# Patient Record
Sex: Female | Born: 1975 | Race: Black or African American | Hispanic: No | Marital: Single | Smoking: Former smoker
Health system: Southern US, Community
[De-identification: ages and names within clinical notes are randomized; demographics above are authoritative.]

## PROBLEM LIST (undated history)

## (undated) DIAGNOSIS — E0590 Thyrotoxicosis, unspecified without thyrotoxic crisis or storm: Secondary | ICD-10-CM

## (undated) DIAGNOSIS — E559 Vitamin D deficiency, unspecified: Secondary | ICD-10-CM

## (undated) DIAGNOSIS — F329 Major depressive disorder, single episode, unspecified: Secondary | ICD-10-CM

## (undated) DIAGNOSIS — F4310 Post-traumatic stress disorder, unspecified: Secondary | ICD-10-CM

## (undated) DIAGNOSIS — F419 Anxiety disorder, unspecified: Secondary | ICD-10-CM

## (undated) DIAGNOSIS — R002 Palpitations: Secondary | ICD-10-CM

## (undated) DIAGNOSIS — F32A Depression, unspecified: Secondary | ICD-10-CM

## (undated) DIAGNOSIS — J45909 Unspecified asthma, uncomplicated: Secondary | ICD-10-CM

## (undated) HISTORY — PX: ECTOPIC PREGNANCY SURGERY: SHX613

## (undated) HISTORY — DX: Vitamin D deficiency, unspecified: E55.9

## (undated) HISTORY — DX: Palpitations: R00.2

## (undated) HISTORY — DX: Depression, unspecified: F32.A

## (undated) HISTORY — DX: Thyrotoxicosis, unspecified without thyrotoxic crisis or storm: E05.90

## (undated) HISTORY — DX: Major depressive disorder, single episode, unspecified: F32.9

## (undated) HISTORY — DX: Anxiety disorder, unspecified: F41.9

---

## 2005-08-18 ENCOUNTER — Emergency Department: Payer: Self-pay | Admitting: Emergency Medicine

## 2005-08-20 ENCOUNTER — Emergency Department: Payer: Self-pay | Admitting: Emergency Medicine

## 2005-08-21 ENCOUNTER — Ambulatory Visit: Payer: Self-pay

## 2005-09-17 ENCOUNTER — Other Ambulatory Visit: Payer: Self-pay

## 2005-09-17 ENCOUNTER — Inpatient Hospital Stay: Payer: Self-pay | Admitting: Obstetrics & Gynecology

## 2006-01-07 ENCOUNTER — Observation Stay: Payer: Self-pay | Admitting: Obstetrics & Gynecology

## 2007-10-30 ENCOUNTER — Emergency Department: Payer: Self-pay | Admitting: Emergency Medicine

## 2008-01-29 ENCOUNTER — Emergency Department: Payer: Self-pay | Admitting: Emergency Medicine

## 2009-06-27 ENCOUNTER — Emergency Department: Payer: Self-pay | Admitting: Internal Medicine

## 2009-07-16 ENCOUNTER — Emergency Department: Payer: Self-pay | Admitting: Emergency Medicine

## 2011-07-23 ENCOUNTER — Ambulatory Visit: Payer: Self-pay | Admitting: Family Medicine

## 2011-08-22 ENCOUNTER — Ambulatory Visit: Payer: Self-pay | Admitting: Family Medicine

## 2011-12-04 ENCOUNTER — Ambulatory Visit: Payer: Self-pay

## 2011-12-12 ENCOUNTER — Ambulatory Visit: Payer: Self-pay

## 2012-10-10 IMAGING — US US THYROID
1 series · 17 of 25 positions shown · non-contrast
Comparison: none

REASON FOR EXAM: goiter
COMMENTS:

[Series 1: us thyroid · 17 of 45 slices shown]
[im 1/45]
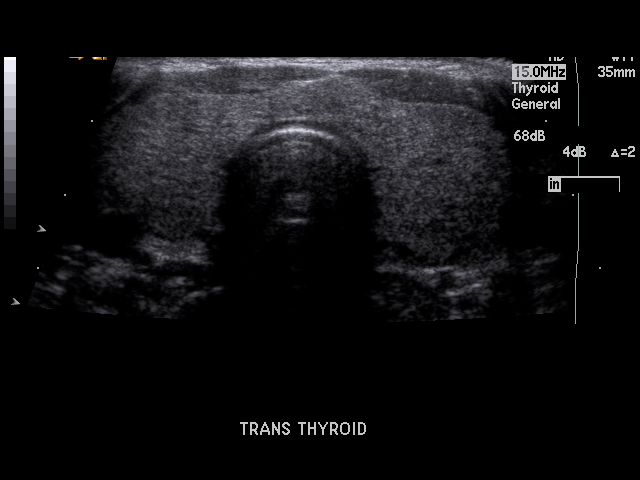
[im 4/45]
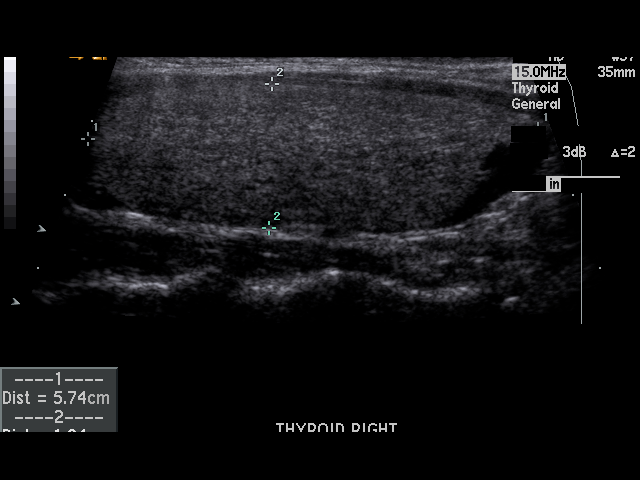
[im 6/45]
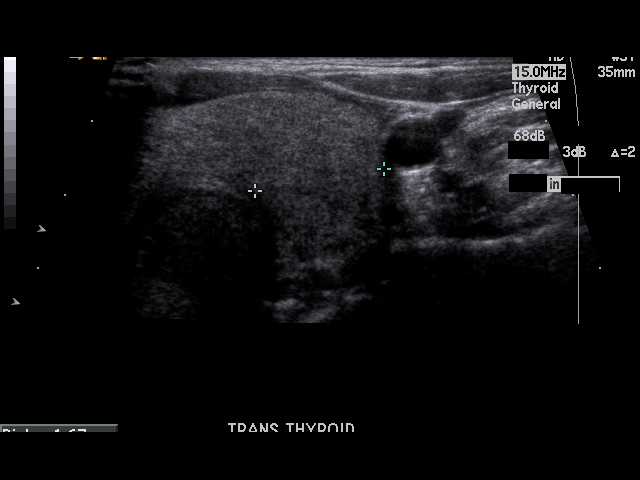
[im 10/45]
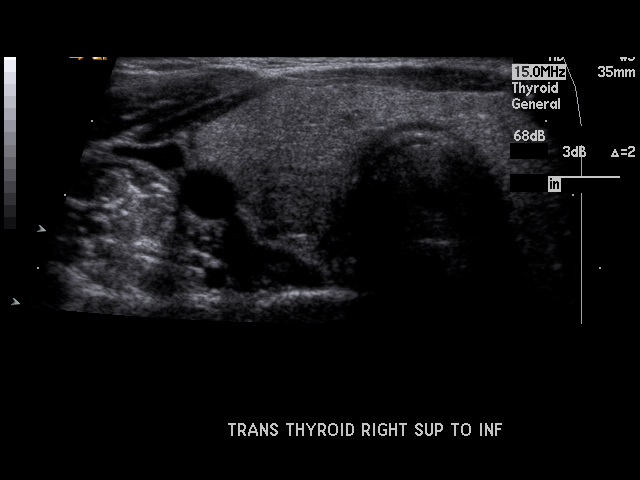
[im 12/45]
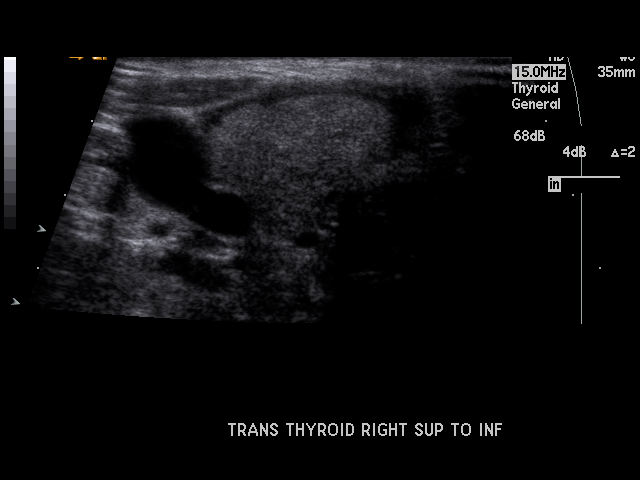
[im 15/45]
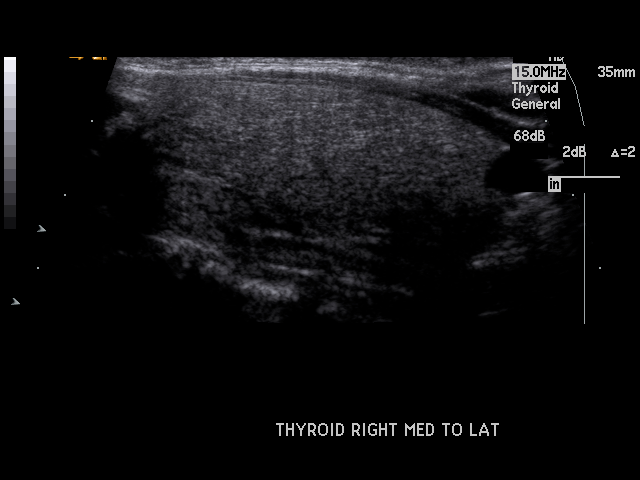
[im 17/45]
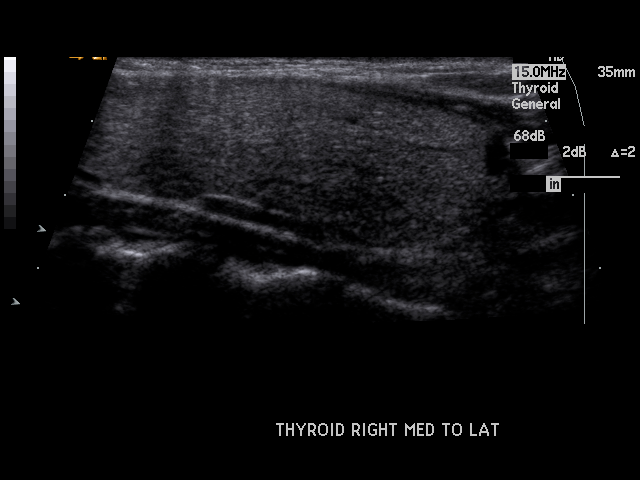
[im 21/45]
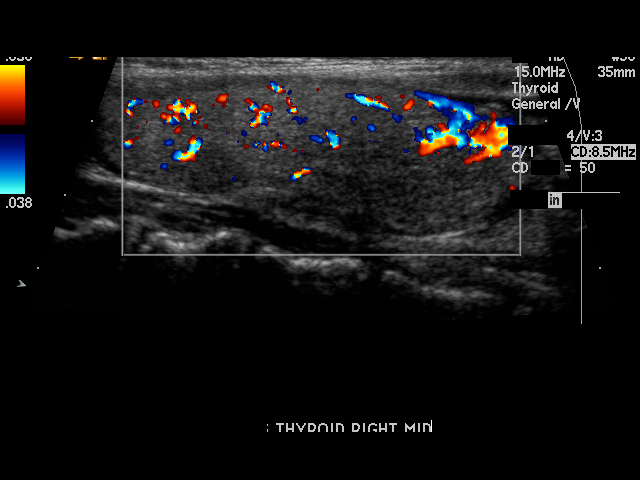
[im 23/45]
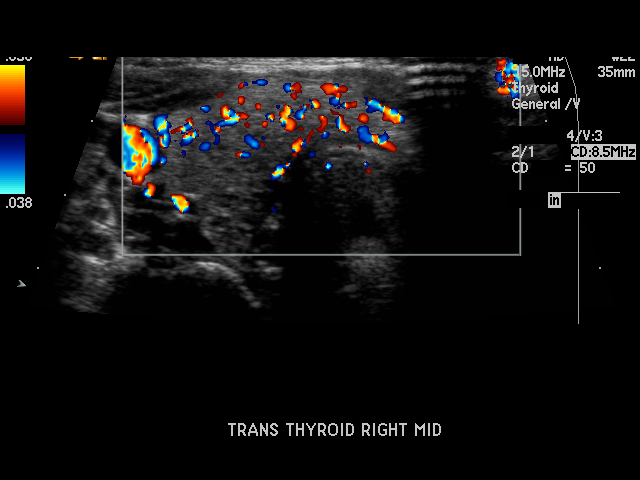
[im 24/45]
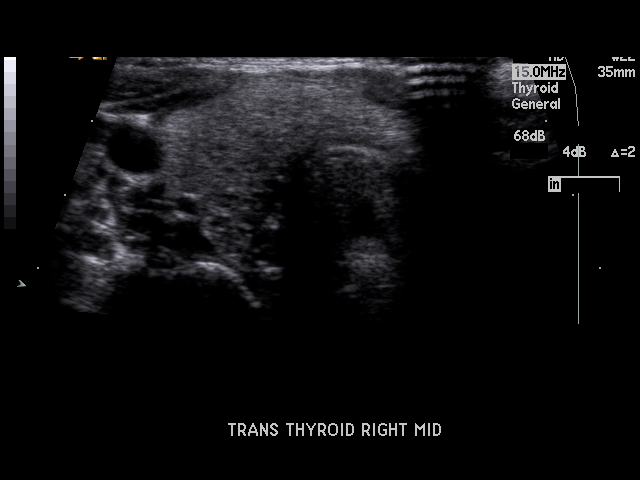
[im 28/45]
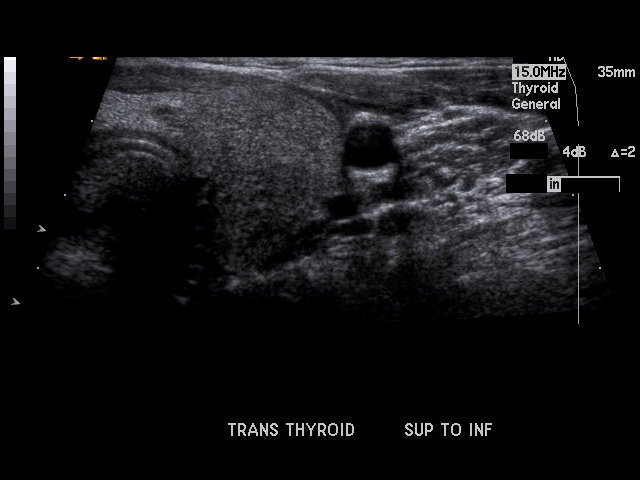
[im 30/45]
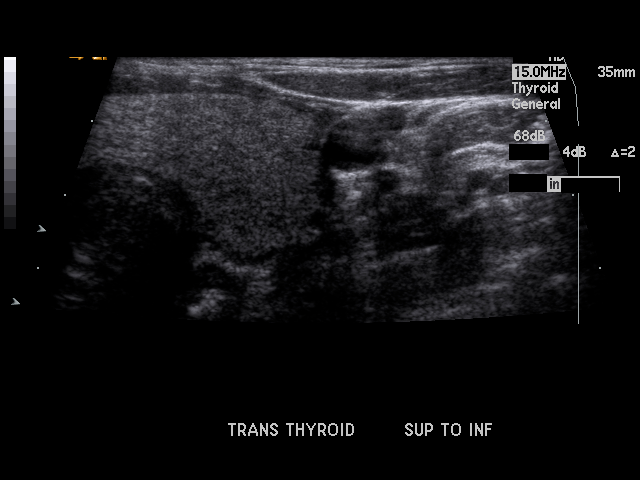
[im 34/45]
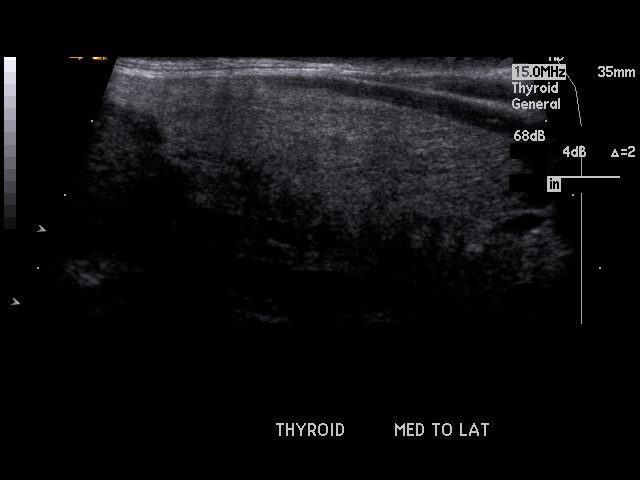
[im 35/45]
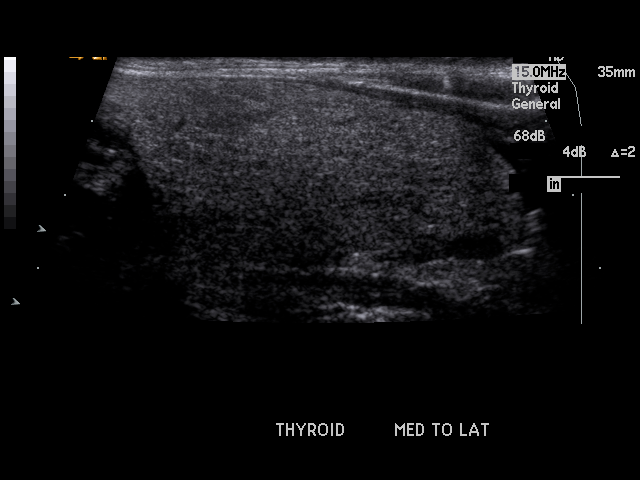
[im 39/45]
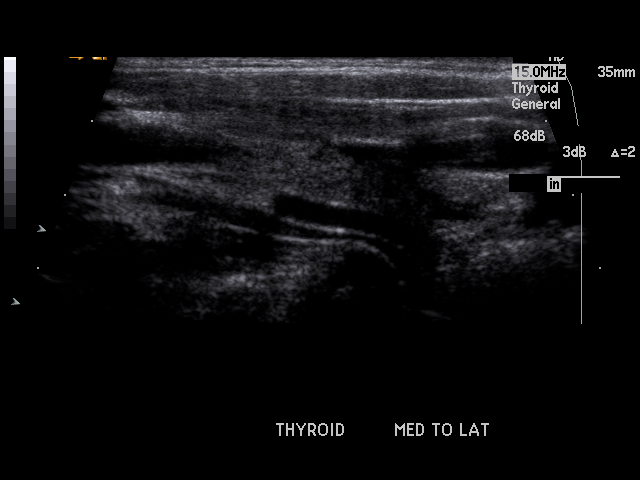
[im 41/45]
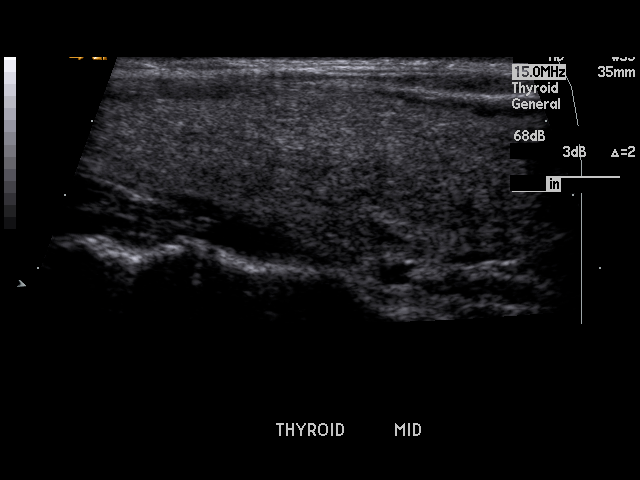
[im 45/45]
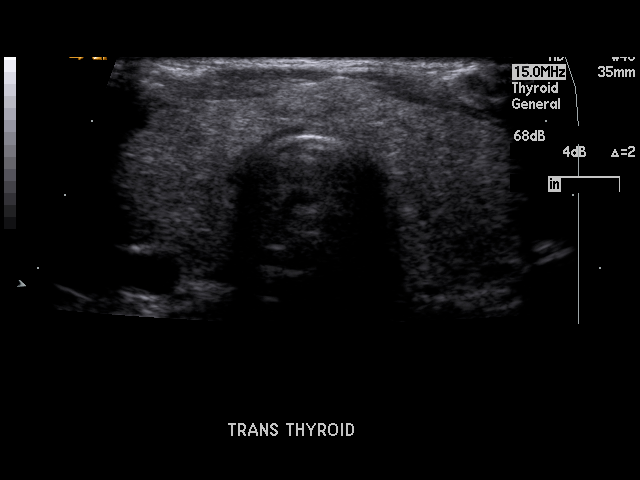

[17 of 25 positions shown; findings below may reference images not displayed]

PROCEDURE:     AUJLA - AUJLA THYROID  - July 23, 2011  [DATE]

RESULT:     The right lobe of the thyroid measures 5.74 cm x 1.84 cm x
cm and the left lobe measures 6.18 cm x 1.89 cm x 1.67 cm. Thyroid
echotexture bilaterally is homogeneous. No thyroid masses or nodules are
seen. No thyroid calcifications are identified.
IMPRESSION: 1. The thyroid appears large but no definite thyroid masses or nodules are
seen.
2. No thyroid calcifications are identified.

## 2013-02-21 IMAGING — NM NM THYROID IMAGING W/ UPTAKE SINGLE (24 HR)
1 series · 3 of 3 positions shown · non-contrast
Comparison: none

REASON FOR EXAM: hyperthyroidism
COMMENTS:

[Series 1000: (id) thyroid scan · 2.40mm/px · 3 of 3 slices shown]
[im 1/3  full-range]
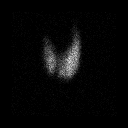
[im 2/3]
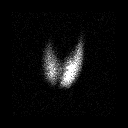
[im 3/3  full-range]
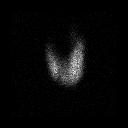

[3 of 3 positions shown; findings below may reference images not displayed]

PROCEDURE:     KNM - KNM THYROID 7-PQN 24HR [DATE]  [DATE]

RESULT:     The patient received a dose of 146.9 uCi of 5odine-FS9. Uptake
is calculated at 6 and 24 hours and measured at 40.1% and 57.4% respectively
which is elevated above the range of normal area images show relatively
homogeneous uptake throughout both glands. The left gland is larger than the
right. There is overall slightly decreased uptake on the right compared to
the left some of which could be secondary to differences and geometry or
thickness. No photopenic or dominant mass is evident.
IMPRESSION: 1. Findings consistent with hyperthyroidism. No focal photopenic or dominant
area of uptake. No suppression demonstrated.

## 2013-06-09 ENCOUNTER — Ambulatory Visit: Payer: Self-pay | Admitting: Family Medicine

## 2014-11-22 ENCOUNTER — Ambulatory Visit: Payer: Self-pay | Admitting: Obstetrics and Gynecology

## 2014-11-22 LAB — PREGNANCY, URINE: Pregnancy Test, Urine: NEGATIVE m[IU]/mL

## 2015-02-21 ENCOUNTER — Encounter: Payer: Self-pay | Admitting: Emergency Medicine

## 2015-02-21 ENCOUNTER — Emergency Department
Admission: EM | Admit: 2015-02-21 | Discharge: 2015-02-21 | Disposition: A | Discharge status: Home / Self Care | Payer: Medicaid Other | Attending: Emergency Medicine | Admitting: Emergency Medicine

## 2015-02-21 DIAGNOSIS — R1084 Generalized abdominal pain: Secondary | ICD-10-CM | POA: Diagnosis present

## 2015-02-21 DIAGNOSIS — N938 Other specified abnormal uterine and vaginal bleeding: Secondary | ICD-10-CM | POA: Diagnosis not present

## 2015-02-21 DIAGNOSIS — Z3202 Encounter for pregnancy test, result negative: Secondary | ICD-10-CM | POA: Insufficient documentation

## 2015-02-21 DIAGNOSIS — Z720 Tobacco use: Secondary | ICD-10-CM | POA: Diagnosis not present

## 2015-02-21 LAB — BASIC METABOLIC PANEL
Anion gap: 6 (ref 5–15)
BUN: 10 mg/dL (ref 6–20)
CO2: 28 mmol/L (ref 22–32)
Calcium: 9.1 mg/dL (ref 8.9–10.3)
Chloride: 107 mmol/L (ref 101–111)
Creatinine, Ser: 0.66 mg/dL (ref 0.44–1.00)
Glucose, Bld: 96 mg/dL (ref 65–99)
Potassium: 3.6 mmol/L (ref 3.5–5.1)
Sodium: 141 mmol/L (ref 135–145)

## 2015-02-21 LAB — CBC WITH DIFFERENTIAL/PLATELET
BASOS ABS: 0 10*3/uL (ref 0–0.1)
Basophils Relative: 0 %
EOS ABS: 0 10*3/uL (ref 0–0.7)
Eosinophils Relative: 1 %
HCT: 40.2 % (ref 35.0–47.0)
Hemoglobin: 13.3 g/dL (ref 12.0–16.0)
LYMPHS ABS: 1.8 10*3/uL (ref 1.0–3.6)
Lymphocytes Relative: 28 %
MCH: 35.3 pg — ABNORMAL HIGH (ref 26.0–34.0)
MCHC: 33.1 g/dL (ref 32.0–36.0)
MCV: 106.4 fL — AB (ref 80.0–100.0)
MONO ABS: 0.5 10*3/uL (ref 0.2–0.9)
MONOS PCT: 8 %
NEUTROS ABS: 4 10*3/uL (ref 1.4–6.5)
Neutrophils Relative %: 63 %
Platelets: 202 10*3/uL (ref 150–440)
RBC: 3.78 MIL/uL — AB (ref 3.80–5.20)
RDW: 14.7 % — AB (ref 11.5–14.5)
WBC: 6.4 10*3/uL (ref 3.6–11.0)

## 2015-02-21 LAB — URINALYSIS COMPLETE WITH MICROSCOPIC (ARMC ONLY)
Bilirubin Urine: NEGATIVE
GLUCOSE, UA: NEGATIVE mg/dL
HGB URINE DIPSTICK: NEGATIVE
Leukocytes, UA: NEGATIVE
NITRITE: NEGATIVE
PH: 6 (ref 5.0–8.0)
Protein, ur: NEGATIVE mg/dL
Specific Gravity, Urine: 1.018 (ref 1.005–1.030)

## 2015-02-21 MED ORDER — ACETAMINOPHEN 325 MG PO TABS
ORAL_TABLET | ORAL | Status: AC
  Filled 2015-02-21: qty 2

## 2015-02-21 MED ORDER — ACETAMINOPHEN 325 MG PO TABS
650.0000 mg | ORAL_TABLET | Freq: Once | ORAL | Status: AC
  Administered 2015-02-21: 650 mg via ORAL

## 2015-02-21 NOTE — ED Notes (Signed)
Pt c/o lower abd pain for the past 3-4 days, states she just stopped her normal period 6 days ago and has had spotting for the past couple of days.

## 2015-02-21 NOTE — ED Notes (Signed)
Pt c/o lower abd cramping and intermittent scant vaginal bleeding x1 week, pt also noted tan discharge. Denies urinary symptoms or fever - also c/o HA as well which she attributes to ED wait times. Pt on cell phone during assessment. Pt's daughter provided w/ snacks and juice. Protocol orders initiated for APAP for pt's HA. Plan of care discussed and explained ER flow w/ pt - pt verbalized understanding however continues to be agitated at wait time.

## 2015-02-21 NOTE — ED Provider Notes (Signed)
[facility] Emergency Department Provider Note    ____________________________________________  Time seen: 21:40  I have reviewed the triage vital signs and the nursing notes.   HISTORY  Chief Complaint Abdominal Pain and Vaginal Bleeding       HPI Cheyenne Callahan is a 39 y.o. female who complains of several days of persistent vaginal bleeding after having her normal period about a week ago as well as some lower abdominal cramping. She states that this cramping feel similar to her usual menstrual pain. She states that it is moderate at its most severe and is currently absent. She states that she is typically very regular and does not understand why she is continuing to have some bleeding several days after her period should've stopped. She denies fever, chills, chest pain, shortness of breath, nausea/vomiting, and bowel habit changes.    History reviewed. No pertinent past medical history.  There are no active problems to display for this patient.   History reviewed. No pertinent past surgical history.  No current outpatient prescriptions on file.  Allergies Review of patient's allergies indicates no known allergies.  History reviewed. No pertinent family history.  Social History History  Substance Use Topics  . Smoking status: Current Every Day Smoker  . Smokeless tobacco: Not on file  . Alcohol Use: Not on file    Review of Systems  Constitutional: Negative for fever. Eyes: Negative for visual changes. ENT: Negative for sore throat. Cardiovascular: Negative for chest pain. Respiratory: Negative for shortness of breath. Gastrointestinal: Negative for vomiting and diarrhea. Positive for intermittent cramping lower abdominal pain. Genitourinary: Negative for dysuria. Positive for intermittent vaginal bleeding for several days. Musculoskeletal: Negative for back pain. Skin: Negative for rash. Neurological: Negative for  headaches, focal weakness or numbness.   10-point ROS otherwise negative.  ____________________________________________   PHYSICAL EXAM:  VITAL SIGNS: ED Triage Vitals  Enc Vitals Group     BP 02/21/15 1706 134/74 mmHg     Pulse Rate 02/21/15 1706 65     Resp 02/21/15 1706 16     Temp 02/21/15 1706 99.1 F (37.3 C)     Temp Source 02/21/15 1706 Oral     SpO2 02/21/15 1706 100 %     Weight 02/21/15 1706 110 lb (49.896 kg)     Height 02/21/15 1706 5\' 6"  (1.676 m)     Head Cir --      Peak Flow --      Pain Score 02/21/15 1707 7     Pain Loc --      Pain Edu? --      Excl. in GC? --      Constitutional: Alert and oriented. Well appearing and in no distress. Angry about her wait in the emergency department.  Eyes: Conjunctivae are normal. PERRL. Normal extraocular movements. ENT   Head: Normocephalic and atraumatic.   Nose: No congestion/rhinnorhea.   Mouth/Throat: Mucous membranes are moist.   Neck: No stridor. Hematological/Lymphatic/Immunilogical: No cervical lymphadenopathy. Cardiovascular: Normal rate, regular rhythm. Normal and symmetric distal pulses are present in all extremities. No murmurs, rubs, or gallops. Respiratory: Normal respiratory effort without tachypnea nor retractions. Breath sounds are clear and equal bilaterally. No wheezes/rales/rhonchi. Gastrointestinal: Soft and nontender. No distention. No abdominal bruits. There is no CVA tenderness. Genitourinary: Patient declined a pelvic exam Musculoskeletal: Nontender with normal range of motion in all extremities. No joint effusions.  No lower extremity tenderness nor edema. Neurologic:  Normal speech and language. No gross focal neurologic  deficits are appreciated. Speech is normal. No gait instability. Skin:  Skin is warm, dry and intact. No rash noted. Psychiatric: Mood and affect are normal. Speech and behavior are normal. Patient exhibits appropriate insight and  judgment.  ____________________________________________    LABS (pertinent positives/negatives)  Chemistry, urinalysis, CBC, and urine pregnancy test are all unremarkable  ____________________________________________   EKG  Not indicated  ____________________________________________    RADIOLOGY  Not indicated  ____________________________________________   PROCEDURES  Procedure(s) performed: None  Critical Care performed: No  ____________________________________________   INITIAL IMPRESSION / ASSESSMENT AND PLAN / ED COURSE  Pertinent labs & imaging results that were available during my care of the patient were reviewed by me and considered in my medical decision making (see chart for details).  The patient is well-appearing, in no acute distress, and has no abdominal tenderness upon palpation. Her vital signs are stable. Her workup is unremarkable. The patient is very irritated with the long wait she has had in the emergency department; I attempted to talk to her about menorrhagia, metrorrhagia, and mittelschmerz, but she is angry and just states that we did nothing for her. She refuses a pelvic exam at this time. I advised her to follow-up at the next available opportunity with her GYN, Dr. Jean Rosenthal. She left angrily without waiting for my discharge instructions, though I did print them and make them available for her.  ____________________________________________   FINAL CLINICAL IMPRESSION(S) / ED DIAGNOSES  Final diagnoses:  Dysfunctional uterine bleeding     Loleta Rose, MD 02/21/15 2157

## 2015-02-21 NOTE — Discharge Instructions (Signed)
As we discussed, her workup in the emergency department was reassuring, including normal blood work, urinalysis, and a negative urine pregnancy test. We offered a pelvic exam to take swabs and rule out infection, but he declined at this time, and given that she had no abdominal tenderness, this seems appropriate at this time. As we recommended, please call Dr. Jean RosenthalJackson tomorrow to schedule the next available appointment to follow-up. If he develops any new or worsening problems or concerns, please return to the emergency department.  Dysfunctional Uterine Bleeding Normally, menstrual periods begin between ages 7211 to 5617 in young women. A normal menstrual cycle/period may begin every 23 days up to 35 days and lasts from 1 to 7 days. Around 12 to 14 days before your menstrual period starts, ovulation (ovary produces an egg) occurs. When counting the time between menstrual periods, count from the first day of bleeding of the previous period to the first day of bleeding of the next period. Dysfunctional (abnormal) uterine bleeding is bleeding that is different from a normal menstrual period. Your periods may come earlier or later than usual. They may be lighter, have blood clots or be heavier. You may have bleeding between periods, or you may skip one period or more. You may have bleeding after sexual intercourse, bleeding after menopause, or no menstrual period. CAUSES   Pregnancy (normal, miscarriage, tubal).  IUDs (intrauterine device, birth control).  Birth control pills.  Hormone treatment.  Menopause.  Infection of the cervix.  Blood clotting problems.  Infection of the inside lining of the uterus.  Endometriosis, inside lining of the uterus growing in the pelvis and other female organs.  Adhesions (scar tissue) inside the uterus.  Obesity or severe weight loss.  Uterine polyps inside the uterus.  Cancer of the vagina, cervix, or uterus.  Ovarian cysts or polycystic ovary  syndrome.  Medical problems (diabetes, thyroid disease).  Uterine fibroids (noncancerous tumor).  Problems with your female hormones.  Endometrial hyperplasia, very thick lining and enlarged cells inside of the uterus.  Medicines that interfere with ovulation.  Radiation to the pelvis or abdomen.  Chemotherapy. DIAGNOSIS   Your doctor will discuss the history of your menstrual periods, medicines you are taking, changes in your weight, stress in your life, and any medical problems you may have.  Your doctor will do a physical and pelvic examination.  Your doctor may want to perform certain tests to make a diagnosis, such as:  Pap test.  Blood tests.  Cultures for infection.  CT scan.  Ultrasound.  Hysteroscopy.  Laparoscopy.  MRI.  Hysterosalpingography.  D and C.  Endometrial biopsy. TREATMENT  Treatment will depend on the cause of the dysfunctional uterine bleeding (DUB). Treatment may include:  Observing your menstrual periods for a couple of months.  Prescribing medicines for medical problems, including:  Antibiotics.  Hormones.  Birth control pills.  Removing an IUD (intrauterine device, birth control).  Surgery:  D and C (scrape and remove tissue from inside the uterus).  Laparoscopy (examine inside the abdomen with a lighted tube).  Uterine ablation (destroy lining of the uterus with electrical current, laser, heat, or freezing).  Hysteroscopy (examine cervix and uterus with a lighted tube).  Hysterectomy (remove the uterus). HOME CARE INSTRUCTIONS   If medicines were prescribed, take exactly as directed. Do not change or switch medicines without consulting your caregiver.  Long term heavy bleeding may result in iron deficiency. Your caregiver may have prescribed iron pills. They help replace the iron that your  body lost from heavy bleeding. Take exactly as directed.  Do not take aspirin or medicines that contain aspirin one week  before or during your menstrual period. Aspirin may make the bleeding worse.  If you need to change your sanitary pad or tampon more than once every 2 hours, stay in bed with your feet elevated and a cold pack on your lower abdomen. Rest as much as possible, until the bleeding stops or slows down.  Eat well-balanced meals. Eat foods high in iron. Examples are:  Leafy green vegetables.  Whole-grain breads and cereals.  Eggs.  Meat.  Liver.  Do not try to lose weight until the abnormal bleeding has stopped and your blood iron level is back to normal. Do not lift more than ten pounds or do strenuous activities when you are bleeding.  For a couple of months, make note on your calendar, marking the start and ending of your period, and the type of bleeding (light, medium, heavy, spotting, clots or missed periods). This is for your caregiver to better evaluate your problem. SEEK MEDICAL CARE IF:   You develop nausea (feeling sick to your stomach) and vomiting, dizziness, or diarrhea while you are taking your medicine.  You are getting lightheaded or weak.  You have any problems that may be related to the medicine you are taking.  You develop pain with your DUB.  You want to remove your IUD.  You want to stop or change your birth control pills or hormones.  You have any type of abnormal bleeding mentioned above.  You are over 81 years old and have not had a menstrual period yet.  You are 39 years old and you are still having menstrual periods.  You have any of the symptoms mentioned above.  You develop a rash. SEEK IMMEDIATE MEDICAL CARE IF:   An oral temperature above 102 F (38.9 C) develops.  You develop chills.  You are changing your sanitary pad or tampon more than once an hour.  You develop abdominal pain.  You pass out or faint. Document Released: 10/05/2000 Document Revised: 12/31/2011 Document Reviewed: 09/06/2009 [facility] Patient Information 2015 [place],  [place]. This information is not intended to replace advice given to you by your health care provider. Make sure you discuss any questions you have with your health care provider.

## 2015-02-21 NOTE — ED Notes (Signed)
Pt ambulating independently w/ steady gait on d/c in no acute distress, A&Ox4.  

## 2015-02-22 LAB — POCT PREGNANCY, URINE: Preg Test, Ur: NEGATIVE

## 2015-10-26 ENCOUNTER — Emergency Department
Admission: EM | Admit: 2015-10-26 | Discharge: 2015-10-26 | Disposition: A | Discharge status: Home / Self Care | Payer: Medicaid Other | Attending: Emergency Medicine | Admitting: Emergency Medicine

## 2015-10-26 DIAGNOSIS — R21 Rash and other nonspecific skin eruption: Secondary | ICD-10-CM | POA: Diagnosis present

## 2015-10-26 DIAGNOSIS — Z91040 Latex allergy status: Secondary | ICD-10-CM | POA: Diagnosis not present

## 2015-10-26 DIAGNOSIS — L42 Pityriasis rosea: Secondary | ICD-10-CM | POA: Diagnosis not present

## 2015-10-26 DIAGNOSIS — F17210 Nicotine dependence, cigarettes, uncomplicated: Secondary | ICD-10-CM | POA: Diagnosis not present

## 2015-10-26 HISTORY — DX: Unspecified asthma, uncomplicated: J45.909

## 2015-10-26 MED ORDER — TRIAMCINOLONE ACETONIDE 0.5 % EX OINT: 1.0000 "application " | TOPICAL_OINTMENT | Freq: Two times a day (BID) | CUTANEOUS | Status: DC

## 2015-10-26 MED ORDER — LORATADINE 10 MG PO TABS
10.0000 mg | ORAL_TABLET | Freq: Every day | ORAL | Status: DC
  Administered 2015-10-26: 10 mg via ORAL
  Filled 2015-10-26: qty 1

## 2015-10-26 NOTE — ED Notes (Signed)
Pt noticed rash on back 2-3 weeks ago and thought it would go away, then about a day ago rash spread to left arm. Pt states she also has a bump on her lip. There has not been any drainage. 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 Multiple areas of dark spots. Pt has multiple allergies. Denies any rash on genitalia.

## 2015-10-26 NOTE — Discharge Instructions (Signed)
Pityriasis Rosea Pityriasis rosea is a rash that usually appears on the trunk of the body. It may also appear on the upper arms and upper legs. It usually begins as a single patch, and then more patches begin to develop. The rash may cause mild itching, but it normally does not cause other problems. It usually goes away without treatment. However, it may take weeks or months for the rash to go away completely. CAUSES The cause of this condition is not known. The condition does not spread from person to person (is noncontagious). RISK FACTORS This condition is more likely to develop in young adults and children. It is most common in the spring and fall. SYMPTOMS The main symptom of this condition is a rash.  The rash usually begins with a single oval patch that is larger than the ones that follow. This is called a herald patch. It generally appears a week or more before the rest of the rash appears.  When more patches start to develop, they spread quickly on the trunk, back, and arms. These patches are smaller than the first one.  The patches that make up the rash are usually oval-shaped and pink or red in color. They are usually flat, but they may sometimes be raised so that they can be felt with a finger. They may also be finely crinkled and have a scaly ring around the edge.  The rash does not typically appear on areas of the skin that are exposed to the sun. Most people who have this condition do not have other symptoms, but some have mild itching. In a few cases, a mild headache or body aches may occur before the rash appears and then go away. DIAGNOSIS Your health care provider may diagnose this condition by doing a physical exam and taking your medical history. To rule out other possible causes for the rash, the health care provider may order blood tests or take a skin sample from the rash to be looked at under a microscope. TREATMENT Usually, treatment is not needed for this condition. The  rash will probably go away on its own in 4-8 weeks. In some cases, a health care provider may recommend or prescribe medicine to reduce itching. HOME CARE INSTRUCTIONS  Take medicines only as directed by your health care provider.  Avoid scratching the affected areas of skin.  Do not take hot baths or use a sauna. Use only warm water when bathing or showering. Heat can increase itching. SEEK MEDICAL CARE IF:  Your rash does not go away in 8 weeks.  Your rash gets much worse.  You have a fever.  You have swelling or pain in the rash area.  You have fluid, blood, or pus coming from the rash area.   This information is not intended to replace advice given to you by your health care provider. Make sure you discuss any questions you have with your health care provider.   Document Released: 11/14/2001 Document Revised: 02/22/2015 Document Reviewed: 09/15/2014 Elsevier Interactive Patient Education 2016 [employer]  Continue to dose a daily allergy medicine for itch relief. [facility]. Apply the steroid cream to the body as directed.

## 2015-10-26 NOTE — ED Provider Notes (Signed)
[facility] Emergency Department Provider Note ____________________________________________  Time seen: 1146  I have reviewed the triage vital signs and the nursing notes.  HISTORY  Chief Complaint  Rash  HPI Cheyenne Callahan is a 40 y.o. female presents to the ED for evaluation of a rash on her back for the last 3 weeks. She describes the rash is similar to a pityriasis rosea she had several years prior. She had some expired, left over antibiotic ointment and ammonium lactate which she applied without significant benefit. She noted some spread of the rash to her arms over the last 2 days. She denies any interim fevers, chills, or sweats. She also has been dosing some Claritin as needed for symptom relief. She did admit to the rash being in somewhat more itchy after several hot showers. She has since been reminded that she needs to take cooler baths and showers and has begun to do so.  Past Medical History  Diagnosis Date  . Asthma     There are no active problems to display for this patient.   Past Surgical History  Procedure Laterality Date  . Ectopic pregnancy surgery      Current Outpatient Rx  Name  Route  Sig  Dispense  Refill  . triamcinolone ointment (KENALOG) 0.5 %   Topical   Apply 1 application topically 2 (two) times daily.   30 g   0    Allergies Latex; Sulfa antibiotics; and Tramadol  No family history on file.  Social History Social History  Substance Use Topics  . Smoking status: Current Every Day Smoker    Types: Cigarettes  . Smokeless tobacco: None  . Alcohol Use: No   Review of Systems  Constitutional: Negative for fever. Eyes: Negative for visual changes. ENT: Negative for sore throat. Cardiovascular: Negative for chest pain. Respiratory: Negative for shortness of breath. Gastrointestinal: Negative for abdominal pain, vomiting and diarrhea. Genitourinary: Negative for dysuria. Musculoskeletal: Negative for  back pain. Skin: Positive for rash. Neurological: Negative for headaches, focal weakness or numbness. ____________________________________________  PHYSICAL EXAM:  VITAL SIGNS: ED Triage Vitals  Enc Vitals Group     BP 10/26/15 1024 115/71 mmHg     Pulse Rate 10/26/15 1024 59     Resp 10/26/15 1024 16     Temp 10/26/15 1024 98.1 F (36.7 C)     Temp Source 10/26/15 1024 Oral     SpO2 10/26/15 1024 99 %     Weight 10/26/15 1024 110 lb (49.896 kg)     Height 10/26/15 1024 5\' 6"  (1.676 m)     Head Cir --      Peak Flow --      Pain Score 10/26/15 1231 0     Pain Loc --      Pain Edu? --      Excl. in GC? --    Constitutional: Alert and oriented. Well appearing and in no distress. Head: Normocephalic and atraumatic.      Eyes: Conjunctivae are normal. PERRL. Normal extraocular movements      Ears: Canals clear. TMs intact bilaterally.   Nose: No congestion/rhinorrhea.   Mouth/Throat: Mucous membranes are moist.   Neck: Supple. No thyromegaly. Hematological/Lymphatic/Immunological: No cervical lymphadenopathy. Cardiovascular: Normal rate, regular rhythm.  Respiratory: Normal respiratory effort. No wheezes/rales/rhonchi. Gastrointestinal: Soft and nontender. No distention. Musculoskeletal: Nontender with normal range of motion in all extremities.  Neurologic:  Normal gait without ataxia. Normal speech and language. No gross focal neurologic deficits are  appreciated. Skin:  Skin is warm, dry and intact. Patient with several distinct, maculopapular, or weight, scaly lesions to the back. She reports initial lesion to the upper left scapula, which is now resolved. She also notes multiple scab lesions to the upper extremities. Psychiatric: Mood and affect are normal. Patient exhibits appropriate insight and judgment. ____________________________________________  INITIAL IMPRESSION / ASSESSMENT AND PLAN / ED COURSE  Patient with clinical presentation consistent with a mild  pityriasis rosea. She'll be discharged with a prescription for triamcinolone ointment to apply as directed. She is encouraged to continue dosing her daily allergy medicine. She is also reminded of the self-limited course of PR. She'll follow up with a primary care provider for ongoing symptoms. ____________________________________________  FINAL CLINICAL IMPRESSION(S) / ED DIAGNOSES  Final diagnoses:  Pityriasis rosea      Lissa HoardJenise V Bacon Markita Stcharles, PA-C 10/26/15 1411  Minna AntisKevin Paduchowski, MD 10/26/15 1513

## 2015-10-26 NOTE — ED Notes (Signed)
Pt c/o nonitchy rash on arms and back for over a week..Marland Kitchen

## 2016-02-24 ENCOUNTER — Emergency Department
Admission: EM | Admit: 2016-02-24 | Discharge: 2016-02-24 | Disposition: A | Discharge status: Home / Self Care | Payer: Medicaid Other | Attending: Emergency Medicine | Admitting: Emergency Medicine

## 2016-02-24 ENCOUNTER — Ambulatory Visit
Admission: EM | Admit: 2016-02-24 | Discharge: 2016-02-24 | Disposition: A | Discharge status: Other Acute Inpatient Hospital | Payer: Medicaid Other | Attending: Family Medicine | Admitting: Family Medicine

## 2016-02-24 ENCOUNTER — Encounter: Payer: Self-pay | Admitting: *Deleted

## 2016-02-24 DIAGNOSIS — N939 Abnormal uterine and vaginal bleeding, unspecified: Secondary | ICD-10-CM

## 2016-02-24 DIAGNOSIS — Z5321 Procedure and treatment not carried out due to patient leaving prior to being seen by health care provider: Secondary | ICD-10-CM | POA: Diagnosis not present

## 2016-02-24 NOTE — ED Notes (Signed)
Pt 12 days late on her period, vaginal bleeding onset this am, pt awoke with bleeding at approx 0800 today. Also RLQ abd pain radiating to back.

## 2016-02-24 NOTE — ED Notes (Addendum)
Pt reports to ED w/ c/o vaginal bleeding.  Pt sts that her period was supposed to come 4/24 but did not.  Sts that vaginal bleeding began last night.  C/o pain in lower abd and back that radiates to R leg.  NAD.  Ambulatory to room.  Skin color WDL, no SOB.

## 2016-04-03 NOTE — ED Provider Notes (Signed)
CSN: [id number]     Arrival date & time 02/24/16  2004 History   First MD Initiated Contact with Patient 02/24/16 2114     Chief Complaint  Patient presents with  . Vaginal Bleeding   (Consider location/radiation/quality/duration/timing/severity/associated sxs/prior Treatment) HPI Comments: 40 yo female with a c/o vaginal bleeding since this am, along with c/o 12 days late on her period and right lower quadrant abdominal pain radiating to her back. Patient states she has a prior h/o an ectopic pregnancy with a negative urine pregnancy test at the time. Denies any fevers, chills, dizziness, syncope, palpitations.   Patient is a 40 y.o. female presenting with vaginal bleeding. The history is provided by the patient.  Vaginal Bleeding   Past Medical History  Diagnosis Date  . Asthma    Past Surgical History  Procedure Laterality Date  . Ectopic pregnancy surgery     History reviewed. No pertinent family history. Social History  Substance Use Topics  . Smoking status: Current Every Day Smoker    Types: Cigarettes  . Smokeless tobacco: None  . Alcohol Use: No   OB History    No data available     Review of Systems  Genitourinary: Positive for vaginal bleeding.    Allergies  Latex; Sulfa antibiotics; and Tramadol  Home Medications   Prior to Admission medications   Medication Sig Start Date End Date Taking? Authorizing Provider  triamcinolone ointment (KENALOG) 0.5 % Apply 1 application topically 2 (two) times daily. 10/26/15   Charlesetta IvoryJenise V Bacon Menshew, PA-C   Meds Ordered and Administered this Visit  Medications - No data to display  BP 123/87 mmHg  Pulse 57  Temp(Src) 98.1 F (36.7 C) (Oral)  Resp 16  Ht 5\' 6"  (1.676 m)  Wt 107 lb (48.535 kg)  BMI 17.28 kg/m2  SpO2 100%  LMP 01/17/2016 (Approximate) No data found.   Physical Exam  Constitutional: She appears well-developed and well-nourished. No distress.  Cardiovascular: Normal rate, regular rhythm, normal  heart sounds and intact distal pulses.   No murmur heard. Pulmonary/Chest: Effort normal and breath sounds normal. No respiratory distress. She has no wheezes. She has no rales.  Abdominal: Soft. Bowel sounds are normal. She exhibits no distension and no mass. There is tenderness (diffuse lower abdominal/pelvic tenderness to palpation; no rebound or guarding). There is no rebound and no guarding.  Skin: No rash noted. She is not diaphoretic.  Nursing note and vitals reviewed.   ED Course  Procedures (including critical care time)  Labs Review Labs Reviewed - No data to display  Imaging Review No results found.   Visual Acuity Review  Right Eye Distance:   Left Eye Distance:   Bilateral Distance:    Right Eye Near:   Left Eye Near:    Bilateral Near:         MDM   1. Vaginal bleeding   (unknown etiology)   Discharge Medication List as of 02/24/2016  9:53 PM    1. Possible etiologies (including possibility of ectopic) reviewed with patient; recommend patient go to ED for further evaluation and management (possible pelvic US); patient in stable condition, verbalizes understanding and will proceed to ED by private vehicle with patient's accompanying friend driving   Payton Mccallumrlando Elinore Shults, MD 04/03/16 2005

## 2016-04-05 ENCOUNTER — Ambulatory Visit
Admission: EM | Admit: 2016-04-05 | Discharge: 2016-04-05 | Disposition: A | Discharge status: Home / Self Care | Payer: Medicaid Other | Attending: Family Medicine | Admitting: Family Medicine

## 2016-04-05 ENCOUNTER — Encounter: Payer: Self-pay | Admitting: *Deleted

## 2016-04-05 ENCOUNTER — Ambulatory Visit: Payer: Medicaid Other

## 2016-04-05 DIAGNOSIS — R071 Chest pain on breathing: Secondary | ICD-10-CM | POA: Diagnosis not present

## 2016-04-05 DIAGNOSIS — R079 Chest pain, unspecified: Secondary | ICD-10-CM | POA: Insufficient documentation

## 2016-04-05 DIAGNOSIS — J45909 Unspecified asthma, uncomplicated: Secondary | ICD-10-CM | POA: Diagnosis not present

## 2016-04-05 DIAGNOSIS — F17210 Nicotine dependence, cigarettes, uncomplicated: Secondary | ICD-10-CM | POA: Diagnosis not present

## 2016-04-05 DIAGNOSIS — R0789 Other chest pain: Secondary | ICD-10-CM

## 2016-04-05 LAB — COMPREHENSIVE METABOLIC PANEL
ALBUMIN: 4.1 g/dL (ref 3.5–5.0)
ALT: 12 U/L — AB (ref 14–54)
AST: 15 U/L (ref 15–41)
Alkaline Phosphatase: 44 U/L (ref 38–126)
Anion gap: 5 (ref 5–15)
BUN: 11 mg/dL (ref 6–20)
CHLORIDE: 108 mmol/L (ref 101–111)
CO2: 26 mmol/L (ref 22–32)
CREATININE: 0.78 mg/dL (ref 0.44–1.00)
Calcium: 9.1 mg/dL (ref 8.9–10.3)
GFR calc Af Amer: >60 mL/min (ref >60)
GFR calc non Af Amer: >60 mL/min (ref >60)
GLUCOSE: 88 mg/dL (ref 65–99)
POTASSIUM: 3.6 mmol/L (ref 3.5–5.1)
SODIUM: 139 mmol/L (ref 135–145)
Total Bilirubin: 1.2 mg/dL (ref 0.3–1.2)
Total Protein: 6.8 g/dL (ref 6.5–8.1)

## 2016-04-05 LAB — PREGNANCY, URINE: PREG TEST UR: NEGATIVE

## 2016-04-05 LAB — AMYLASE: Amylase: 54 U/L (ref 28–100)

## 2016-04-05 LAB — CBC WITH DIFFERENTIAL/PLATELET
BASOS PCT: 1 %
Basophils Absolute: 0 10*3/uL (ref 0–0.1)
Eosinophils Absolute: 0.2 10*3/uL (ref 0–0.7)
Eosinophils Relative: 3 %
HEMATOCRIT: 39.1 % (ref 35.0–47.0)
Hemoglobin: 13.3 g/dL (ref 12.0–16.0)
LYMPHS PCT: 28 %
Lymphs Abs: 1.8 10*3/uL (ref 1.0–3.6)
MCH: 34.9 pg — ABNORMAL HIGH (ref 26.0–34.0)
MCHC: 33.9 g/dL (ref 32.0–36.0)
MCV: 103 fL — AB (ref 80.0–100.0)
MONO ABS: 0.4 10*3/uL (ref 0.2–0.9)
Monocytes Relative: 7 %
Neutro Abs: 3.8 10*3/uL (ref 1.4–6.5)
Neutrophils Relative %: 61 %
Platelets: 200 10*3/uL (ref 150–440)
RBC: 3.8 MIL/uL (ref 3.80–5.20)
RDW: 15.4 % — AB (ref 11.5–14.5)
WBC: 6.2 10*3/uL (ref 3.6–11.0)

## 2016-04-05 LAB — LIPASE, BLOOD: LIPASE: 12 U/L (ref 11–51)

## 2016-04-05 LAB — FIBRIN DERIVATIVES D-DIMER (ARMC ONLY): Fibrin derivatives D-dimer (ARMC): 128 (ref 0–499)

## 2016-04-05 LAB — URINALYSIS COMPLETE WITH MICROSCOPIC (ARMC ONLY)
Bilirubin Urine: NEGATIVE
Glucose, UA: NEGATIVE mg/dL
Ketones, ur: NEGATIVE mg/dL
Leukocytes, UA: NEGATIVE
NITRITE: NEGATIVE
PROTEIN: NEGATIVE mg/dL
SPECIFIC GRAVITY, URINE: 1.02 (ref 1.005–1.030)
pH: 7 (ref 5.0–8.0)

## 2016-04-05 LAB — CK: CK TOTAL: 82 U/L (ref 38–234)

## 2016-04-05 LAB — TROPONIN I

## 2016-04-05 LAB — CKMB (ARMC ONLY): CK, MB: 0.8 ng/mL (ref 0.5–5.0)

## 2016-04-05 MED ORDER — KETOROLAC TROMETHAMINE 60 MG/2ML IM SOLN
60.0000 mg | Freq: Once | INTRAMUSCULAR | Status: AC
  Administered 2016-04-05: 60 mg via INTRAMUSCULAR

## 2016-04-05 MED ORDER — MELOXICAM 15 MG PO TABS: 15.0000 mg | ORAL_TABLET | Freq: Every day | ORAL | Status: DC

## 2016-04-05 NOTE — Discharge Instructions (Signed)
Costochondritis Costochondritis is a condition in which the tissue (cartilage) that connects your ribs with your breastbone (sternum) becomes irritated. It causes pain in the chest and rib area. It usually goes away on its own over time. HOME CARE  Avoid activities that wear you out.  Do not strain your ribs. Avoid activities that use your:  Chest.  Belly.  Side muscles.  Put ice on the area for the first 2 days after the pain starts.  Put ice in a plastic bag.  Place a towel between your skin and the bag.  Leave the ice on for 20 minutes, 2-3 times a day.  Only take medicine as told by your doctor. GET HELP IF:  You have redness or puffiness (swelling) in the rib area.  Your pain does not go away with rest or medicine. GET HELP RIGHT AWAY IF:   Your pain gets worse.  You are very uncomfortable.  You have trouble breathing.  You cough up blood.  You start sweating or throwing up (vomiting).  You have a fever or lasting symptoms for more than 2-3 days.  You have a fever and your symptoms suddenly get worse. MAKE SURE YOU:   Understand these instructions.  Will watch your condition.  Will get help right away if you are not doing well or get worse.   This information is not intended to replace advice given to you by your health care provider. Make sure you discuss any questions you have with your health care provider.   Document Released: 03/26/2008 Document Revised: 06/10/2013 Document Reviewed: 05/12/2013 Elsevier Interactive Patient Education 2016 Elsevier Inc.  Chest Wall Pain Chest wall pain is pain in or around the bones and muscles of your chest. Sometimes, an injury causes this pain. Sometimes, the cause may not be known. This pain may take several weeks or longer to get better. HOME CARE Pay attention to any changes in your symptoms. Take these actions to help with your pain:  Rest as told by your doctor.  Avoid activities that cause pain. Try not  to use your chest, belly (abdominal), or side muscles to lift heavy things.  If directed, apply ice to the painful area:  Put ice in a plastic bag.  Place a towel between your skin and the bag.  Leave the ice on for 20 minutes, 2-3 times per day.  Take over-the-counter and prescription medicines only as told by your doctor.  Do not use tobacco products, including cigarettes, chewing tobacco, and e-cigarettes. If you need help quitting, ask your doctor.  Keep all follow-up visits as told by your doctor. This is important. GET HELP IF:  You have a fever.  Your chest pain gets worse.  You have new symptoms. GET HELP RIGHT AWAY IF:  You feel sick to your stomach (nauseous) or you throw up (vomit).  You feel sweaty or light-headed.  You have a cough with phlegm (sputum) or you cough up blood.  You are short of breath.   This information is not intended to replace advice given to you by your health care provider. Make sure you discuss any questions you have with your health care provider.   Document Released: 03/26/2008 Document Revised: 06/29/2015 Document Reviewed: 01/03/2015 Elsevier Interactive Patient Education [employer] Elsevier Inc.

## 2016-04-05 NOTE — ED Notes (Signed)
Right sided chest pain 9/10, described as sharp x4 days. Denies other symptoms.

## 2016-04-05 NOTE — ED Provider Notes (Signed)
CSN: [id number]     Arrival date & time 04/05/16  1909 History   First MD Initiated Contact with Patient 04/05/16 1928    Nurses notes were reviewed. Chief Complaint  Patient presents with  . Chest Pain   (Consider location/radiation/quality/duration/timing/severity/associated sxs/prior Treatment) Patient is a 40 y.o. female presenting with chest pain. The history is provided by the patient. No language interpreter was used.  Chest Pain Pain location:  Substernal area, R chest, R lateral chest and epigastric Pain quality: aching and sharp   Pain radiates to:  Upper back and R shoulder Pain radiates to the back: yes   Pain severity:  Moderate Onset quality:  Sudden Duration:  4 days Timing:  Constant Progression:  Worsening Chronicity:  New Context: breathing and movement   Worsened by:  Nothing tried Ineffective treatments:  None tried Associated symptoms: no cough and no diaphoresis   Risk factors: diabetes mellitus   Risk factors: no high cholesterol, no hypertension, not female, not obese, not pregnant and no prior DVT/PE     Past Medical History  Diagnosis Date  . Asthma    Past Surgical History  Procedure Laterality Date  . Ectopic pregnancy surgery     History reviewed. No pertinent family history. Social History  Substance Use Topics  . Smoking status: Current Every Day Smoker    Types: Cigarettes  . Smokeless tobacco: None  . Alcohol Use: No   OB History    No data available     Review of Systems  Constitutional: Negative for diaphoresis.  Respiratory: Negative for cough.   Cardiovascular: Positive for chest pain.  All other systems reviewed and are negative.   Allergies  Latex; Sulfa antibiotics; and Tramadol  Home Medications   Prior to Admission medications   Medication Sig Start Date End Date Taking? Authorizing Provider  triamcinolone ointment (KENALOG) 0.5 % Apply 1 application topically 2 (two) times daily. 10/26/15  Yes Jenise V Bacon Menshew,  PA-C  meloxicam (MOBIC) 15 MG tablet Take 1 tablet (15 mg total) by mouth daily. 04/05/16   Hassan Rowan, MD   Meds Ordered and Administered this Visit   Medications  ketorolac (TORADOL) injection 60 mg (60 mg Intramuscular Given 04/05/16 2014)    Pulse 85  Temp(Src) 98.6 F (37 C) (Oral)  Resp 16  Ht 5\' 6"  (1.676 m)  Wt 110 lb (49.896 kg)  BMI 17.76 kg/m2  SpO2 100%  LMP 03/22/2016 (Exact Date) No data found.   Physical Exam  Constitutional: She is oriented to person, place, and time. She appears well-nourished.  HENT:  Head: Normocephalic and atraumatic.  Right Ear: External ear normal.  Left Ear: External ear normal.  Eyes: Conjunctivae are normal. Pupils are equal, round, and reactive to light.  Neck: Normal range of motion. Neck supple. No tracheal deviation present.  Cardiovascular: Normal rate, regular rhythm and normal heart sounds.   Pulmonary/Chest: Effort normal and breath sounds normal. No respiratory distress.    Patient with right-sided chest wall tenderness. Also some tenderness under the right costal margin as well.  Abdominal: Soft. Normal appearance. She exhibits shifting dullness. There is no hepatosplenomegaly, splenomegaly or hepatomegaly. There is tenderness in the right upper quadrant and epigastric area. There is no CVA tenderness. No hernia.    Musculoskeletal: Normal range of motion.  Neurological: She is alert and oriented to person, place, and time. No cranial nerve deficit.  Skin: Skin is warm and dry.  Psychiatric: She has a normal mood and affect.  Vitals reviewed.   ED Course  Procedures (including critical care time)  Labs Review Labs Reviewed  URINALYSIS COMPLETEWITH MICROSCOPIC ([facility] ONLY) - Abnormal; Notable for the following:    APPearance CLOUDY (*)    Hgb urine dipstick TRACE (*)    Bacteria, UA MANY (*)    Squamous Epithelial / LPF 0-5 (*)    All other components within normal limits  CBC WITH DIFFERENTIAL/PLATELET -  Abnormal; Notable for the following:    MCV 103.0 (*)    MCH 34.9 (*)    RDW 15.4 (*)    All other components within normal limits  COMPREHENSIVE METABOLIC PANEL - Abnormal; Notable for the following:    ALT 12 (*)    All other components within normal limits  URINE CULTURE  PREGNANCY, URINE  AMYLASE  LIPASE, BLOOD  TROPONIN I  CK  CKMB([facility] ONLY)  FIBRIN DERIVATIVES D-DIMER [facility] ONLY)    Imaging Review Dg Chest 2 View  04/05/2016  CLINICAL DATA:  Right-sided chest pain for several days EXAM: CHEST  2 VIEW COMPARISON:  None. FINDINGS: The heart size and mediastinal contours are within normal limits. Both lungs are clear. The visualized skeletal structures are unremarkable. IMPRESSION: No active cardiopulmonary disease. Electronically Signed   By: Alcide Clever M.D.   On: 04/05/2016 20:51    .  Visual Acuity Review  Right Eye Distance:   Left Eye Distance:   Bilateral Distance:    Right Eye Near:   Left Eye Near:    Bilateral Near:      Results for orders placed or performed during the hospital encounter of 04/05/16  Urinalysis complete, with microscopic  Result Value Ref Range   Color, Urine YELLOW YELLOW   APPearance CLOUDY (A) CLEAR   Glucose, UA NEGATIVE NEGATIVE mg/dL   Bilirubin Urine NEGATIVE NEGATIVE   Ketones, ur NEGATIVE NEGATIVE mg/dL   Specific Gravity, Urine 1.020 1.005 - 1.030   Hgb urine dipstick TRACE (A) NEGATIVE   pH 7.0 5.0 - 8.0   Protein, ur NEGATIVE NEGATIVE mg/dL   Nitrite NEGATIVE NEGATIVE   Leukocytes, UA NEGATIVE NEGATIVE   RBC / HPF 0-5 0 - 5 RBC/hpf   WBC, UA 6-30 0 - 5 WBC/hpf   Bacteria, UA MANY (A) NONE SEEN   Squamous Epithelial / LPF 0-5 (A) NONE SEEN  Pregnancy, urine  Result Value Ref Range   Preg Test, Ur NEGATIVE NEGATIVE  CBC with Differential  Result Value Ref Range   WBC 6.2 3.6 - 11.0 K/uL   RBC 3.80 3.80 - 5.20 MIL/uL   Hemoglobin 13.3 12.0 - 16.0 g/dL   HCT 16.1 09.6 - 04.5 %   MCV 103.0 (H) 80.0 - 100.0 fL    MCH 34.9 (H) 26.0 - 34.0 pg   MCHC 33.9 32.0 - 36.0 g/dL   RDW 40.9 (H) 81.1 - 91.4 %   Platelets 200 150 - 440 K/uL   Neutrophils Relative % 61 %   Neutro Abs 3.8 1.4 - 6.5 K/uL   Lymphocytes Relative 28 %   Lymphs Abs 1.8 1.0 - 3.6 K/uL   Monocytes Relative 7 %   Monocytes Absolute 0.4 0.2 - 0.9 K/uL   Eosinophils Relative 3 %   Eosinophils Absolute 0.2 0 - 0.7 K/uL   Basophils Relative 1 %   Basophils Absolute 0.0 0 - 0.1 K/uL  Amylase  Result Value Ref Range   Amylase 54 28 - 100 U/L  Lipase, blood  Result Value Ref Range  Lipase 12 11 - 51 U/L  Troponin I  Result Value Ref Range   Troponin I <0.03 <0.031 ng/mL  Comprehensive metabolic panel  Result Value Ref Range   Sodium 139 135 - 145 mmol/L   Potassium 3.6 3.5 - 5.1 mmol/L   Chloride 108 101 - 111 mmol/L   CO2 26 22 - 32 mmol/L   Glucose, Bld 88 65 - 99 mg/dL   BUN 11 6 - 20 mg/dL   Creatinine, Ser 4.780.78 0.44 - 1.00 mg/dL   Calcium 9.1 8.9 - 29.510.3 mg/dL   Total Protein 6.8 6.5 - 8.1 g/dL   Albumin 4.1 3.5 - 5.0 g/dL   AST 15 15 - 41 U/L   ALT 12 (L) 14 - 54 U/L   Alkaline Phosphatase 44 38 - 126 U/L   Total Bilirubin 1.2 0.3 - 1.2 mg/dL   GFR calc non Af Amer >60 >60 mL/min   GFR calc Af Amer >60 >60 mL/min   Anion gap 5 5 - 15  CK  Result Value Ref Range   Total CK 82 38 - 234 U/L  CKMB([facility] only)  Result Value Ref Range   CK, MB 0.8 0.5 - 5.0 ng/mL  Fibrin derivatives D-Dimer ([facility] only)  Result Value Ref Range   Fibrin derivatives D-dimer ([facility]) 128 0 - 499     MDM   1. Costochondral chest pain   2. Chest pain on breathing     Because of the tenderness in the right upper quadrant and over the right chest wall. Will administer 60 Toradol IM with 2 cardiac enzymes chest pain going on for about 3-4 days twisting cardiac enzymes were positive history coronary artery disease will get d-dimer cystitis or takes deep breath and continued chest x-ray as well. Even if costochondritis is a final diagnosis  also suggest to her that she needs to be evaluated for possible gallbladder disease as well.   ED ECG REPORT I, Geri Hepler H, the attending physician, personally viewed and interpreted this ECG.   Date: 04/05/2016  EKG Time:19:20:27  Rate: 78  Rhythm: unchanged from previous tracings, normal sinus rhythm, patient has some T-wave abnormalities and some anterior ischemia. But this is really essentially unchanged from previous EKG  Axis: 14  Intervals:none  ST&T Change: None significant  Is occasional PVC and some nonspecific T-wave abnormalities. Which is stated above his been really unchanged from previous EKG Was read as being a normal EKG.   Cardiac enzymes negative d-dimer is negative chest x-rays negative. Patient improved after the injection Toradol which may prescription is consistent with costochondritis. Allow her to follow-up with PCP as far as possible doing some gallbladder evaluation since she did have some tenderness in the right upper quadrant as well. We'll also place him over the costochondritis and recommend she stop smoking. Discomfort or problems persist go to the ED of her choice.  Note: This dictation was prepared with Dragon dictation along with smaller phrase technology. Any transcriptional errors that result from this process are unintentional.  Hassan RowanEugene Isaiha Asare, MD 04/05/16 2124

## 2016-04-08 ENCOUNTER — Telehealth: Payer: Self-pay | Admitting: Family Medicine

## 2016-04-08 LAB — URINE CULTURE
Culture: >=100000 — AB
Special Requests: NORMAL

## 2016-04-08 MED ORDER — NITROFURANTOIN MONOHYD MACRO 100 MG PO CAPS: 100.0000 mg | ORAL_CAPSULE | Freq: Two times a day (BID) | ORAL | Status: DC

## 2016-04-08 NOTE — ED Notes (Signed)
Medication to be called in for UTI by nurse.  Jolene ProvostKirtida Laurel Harnden, MD 04/08/16 1158

## 2017-03-28 ENCOUNTER — Encounter: Payer: Self-pay | Admitting: Obstetrics and Gynecology

## 2017-05-03 ENCOUNTER — Ambulatory Visit (INDEPENDENT_AMBULATORY_CARE_PROVIDER_SITE_OTHER): Payer: Medicaid Other | Admitting: Obstetrics and Gynecology

## 2017-05-03 ENCOUNTER — Encounter: Payer: Self-pay | Admitting: Obstetrics and Gynecology

## 2017-05-03 VITALS — BP 92/62 | HR 101 | Ht 66.0 in | Wt 109.6 lb

## 2017-05-03 DIAGNOSIS — N979 Female infertility, unspecified: Secondary | ICD-10-CM | POA: Diagnosis not present

## 2017-05-03 DIAGNOSIS — N96 Recurrent pregnancy loss: Secondary | ICD-10-CM

## 2017-05-03 DIAGNOSIS — F1210 Cannabis abuse, uncomplicated: Secondary | ICD-10-CM | POA: Diagnosis not present

## 2017-05-03 NOTE — Progress Notes (Signed)
GYNECOLOGY PROGRESS NOTE  Subjective:    Patient ID: Cheyenne Callahan, female    DOB: April 19, 1976, 41 y.o.   MRN: [id number]  HPI  Patient is a 41 y.o. [id number] female who presents for complaints of irregular menstrual cycles.  Notes that her cycles are still occurring q 28-30 days, however states that the length of the cycles have changed from 5 days to 4 days.  States that her cycles are also heavy, however notes only having to change 2-3 pads on her heaviest days.   She also notes that she is concerned that she has not been able to conceive.  Patient reports that she and her current partner have been trying for over 2 years.  Her partner is younger (ae 63), and has 1 child already (age 75). Patient states that she underwent a workup last year (performed at [facility]), that was essentially normal, except [facility] did note that her left(?) tube was a little sluggish (same side as her previous ectopic pregnancy). Has been using a fertility tracker app on her phone, notes intercourse multiple times during the month, with at least 1-2 times during her fertile week.    OB History  Gravida Para Term Preterm AB Living  8 2 2   6 2   SAB TAB Ectopic Multiple Live Births  3 2 1   2     # Outcome Date GA Lbr Len/2nd Weight Sex Delivery Anes PTL Lv  8 SAB           7 SAB           6 SAB           5 TAB           4 TAB           3 Ectopic           2 Term           1 Term                Past Medical History:  Diagnosis Date  . Anxiety   . Asthma   . Depression   . Heart palpitations   . Hyperthyroidism   . Vitamin D deficiency     Family History  Problem Relation Age of Onset  . Fibroids Mother   . Breast cancer Neg Hx   . Ovarian cancer Neg Hx   . Stroke Neg Hx     Past Surgical History:  Procedure Laterality Date  . ECTOPIC PREGNANCY SURGERY      Social History   Social History  . Marital status: Single    Spouse name: N/A  . Number of children: N/A  . Years  of education: N/A   Occupational History  . Not on file.   Social History Main Topics  . Smoking status: Former Smoker    Types: Cigarettes  . Smokeless tobacco: Never Used  . Alcohol use Yes     Comment: Socially   . Drug use: Yes    Types: Marijuana  . Sexual activity: Yes    Birth control/ protection: None   Other Topics Concern  . Not on file   Social History Narrative  . No narrative on file    No current outpatient prescriptions on file prior to visit.   No current facility-administered medications on file prior to visit.     Allergies  Allergen Reactions  . Latex Hives  . Sulfa Antibiotics Hives  .  Tramadol Hives    Review of Systems Pertinent items noted in HPI and remainder of comprehensive ROS otherwise negative.   Objective:   Blood pressure 92/62, pulse (!) 101, height 5\' 6"  (1.676 m), weight 109 lb 9.6 oz (49.7 kg), last menstrual period 04/13/2017. General appearance: alert and no distress Abdomen: soft, non-tender; bowel sounds normal; no masses,  no organomegaly Pelvic: external genitalia normal, rectovaginal septum normal.  Vagina without discharge.  Cervix normal appearing, no lesions and no motion tenderness.  Uterus mobile, nontender, normal shape and size.  Adnexae non-palpable, nontender bilaterally.  Extremities: extremities normal, atraumatic, no cyanosis or edema Neurologic: Grossly normal   Assessment:   Secondary infertility H/o multiple miscarriages Marijuana use  Plan:   - Reassured patient that although her cycles have shortened by a day, they were still regular and normal.  - Discussed fertility issues with patient. She currently is of advanced maternal age, with h/o ectopic pregnancy and multiple miscarriages.  She also admits to heavy marijuana use which she knows she will need to discontinue.  Advised that both she and partner would need cessation of marijuana as smoking has been associated with decreased motility of cilia in  fallopian tubes and lowered sperm motility (and possibly decreased count). Advised on timed coitus (at least every other day during fertile week), and delaying mobility for at least 30 minutes after intercourse.  Will also order [facility] and recheck hormone levels (have not been checked since last year).  Advised that patient would need to optimize her health (has h/o hyperthyroidism).  Discussed that she may require assistance with fertility medications, or even advanced fertility procedures including IUI, IVF. [facility].    A total of 30 minutes were spent face-to-face with the patient during the encounter with greater than 50% dealing with counseling and coordination of care.  Hildred Laserherry, Cheyenne Porche, MD [facility]

## 2017-05-04 ENCOUNTER — Encounter: Payer: Self-pay | Admitting: Obstetrics and Gynecology

## 2017-05-08 ENCOUNTER — Other Ambulatory Visit: Payer: Medicaid Other

## 2017-05-08 ENCOUNTER — Telehealth: Payer: Self-pay | Admitting: Obstetrics and Gynecology

## 2017-05-08 ENCOUNTER — Other Ambulatory Visit: Payer: Self-pay

## 2017-05-08 NOTE — Telephone Encounter (Signed)
Called pt she states that she is concerned that her period is starting a week early. Advised pt that per the discussion she had with Dr.Cherry periods are normal as long as they are in a 21 to 35 days window. Which she is in. Advised pt to come in for labs which will give us a better idea of what may be going on. Pt gave verbal understanding, will come today at 1:30pm for lab draw.

## 2017-05-08 NOTE — Telephone Encounter (Signed)
ERROR. SEE PREVIOUS ENCOUNTER.

## 2017-05-08 NOTE — Telephone Encounter (Signed)
Patient called and stated that she wants to speak with a nurse, because her "period came on a week early" She is concerned and does not know "what is going on". Patient did not disclose any other information. Please advise.

## 2017-05-11 LAB — FSH/LH
FSH: 4 m[IU]/mL
FSH: 8.5 m[IU]/mL
LH: 4.1 m[IU]/mL
LH: 7.5 m[IU]/mL

## 2017-05-11 LAB — PROGESTERONE
PROGESTERONE: 0.9 ng/mL
PROGESTERONE: 8.6 ng/mL

## 2017-05-11 LAB — ESTRADIOL
ESTRADIOL: 51.5 pg/mL
Estradiol: 29.3 pg/mL

## 2017-05-11 LAB — ANTI MULLERIAN HORMONE
ANTI-MULLERIAN HORMONE (AMH): 3.31 ng/mL
ANTI-MULLERIAN HORMONE (AMH): 3.59 ng/mL

## 2017-05-13 ENCOUNTER — Telehealth: Payer: Self-pay

## 2017-05-13 NOTE — Telephone Encounter (Signed)
Called pt informed her normal labs. Pt gave verbal understanding.

## 2017-05-13 NOTE — Telephone Encounter (Signed)
-----   Message from Hildred LaserAnika Cherry, MD sent at 05/11/2017  9:44 PM EDT ----- Please inform patient that her labs (hormone levels) are normal, including her levels to detect her ovarian reserve (chances for pregnancy based on her age).

## 2019-04-08 ENCOUNTER — Encounter: Payer: Self-pay | Admitting: Emergency Medicine

## 2019-04-08 ENCOUNTER — Emergency Department
Admission: EM | Admit: 2019-04-08 | Discharge: 2019-04-09 | Disposition: A | Discharge status: Home / Self Care | Payer: Medicaid Other | Attending: Emergency Medicine | Admitting: Emergency Medicine

## 2019-04-08 ENCOUNTER — Other Ambulatory Visit: Payer: Self-pay

## 2019-04-08 DIAGNOSIS — Z87891 Personal history of nicotine dependence: Secondary | ICD-10-CM | POA: Diagnosis not present

## 2019-04-08 DIAGNOSIS — J45909 Unspecified asthma, uncomplicated: Secondary | ICD-10-CM | POA: Diagnosis not present

## 2019-04-08 DIAGNOSIS — Y9389 Activity, other specified: Secondary | ICD-10-CM | POA: Insufficient documentation

## 2019-04-08 DIAGNOSIS — M542 Cervicalgia: Secondary | ICD-10-CM | POA: Diagnosis not present

## 2019-04-08 DIAGNOSIS — Y999 Unspecified external cause status: Secondary | ICD-10-CM | POA: Diagnosis not present

## 2019-04-08 DIAGNOSIS — Y92410 Unspecified street and highway as the place of occurrence of the external cause: Secondary | ICD-10-CM | POA: Diagnosis not present
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
## 2019-04-08 NOTE — ED Notes (Signed)
EDP in with patient 
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
## 2019-04-09 ENCOUNTER — Emergency Department: Payer: Medicaid Other

## 2019-04-09 MED ORDER — CYCLOBENZAPRINE HCL 10 MG PO TABS: 10.0000 mg | ORAL_TABLET | Freq: Three times a day (TID) | ORAL | 0 refills | Status: DC | PRN

## 2019-04-09 MED ORDER — CYCLOBENZAPRINE HCL 10 MG PO TABS
10.0000 mg | ORAL_TABLET | Freq: Once | ORAL | Status: AC
  Administered 2019-04-09: 10 mg via ORAL
  Filled 2019-04-09: qty 1
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
## 2019-04-09 NOTE — ED Notes (Signed)
Soft cervical collar placed on patient.

## 2019-04-09 NOTE — ED Notes (Addendum)
Wrong patient

## 2019-04-09 NOTE — ED Notes (Signed)
Patient transferred to Xray

## 2019-04-09 NOTE — ED Notes (Signed)
C-collar was placed on patient. After 10 mins of wearing c-collar patient states take this thing off. I can's wear this shit. At the request of patient. C-collar was removed. EDP made aware.

## 2019-10-21 ENCOUNTER — Other Ambulatory Visit: Payer: Self-pay | Admitting: Family Medicine

## 2019-10-21 DIAGNOSIS — Z1231 Encounter for screening mammogram for malignant neoplasm of breast: Secondary | ICD-10-CM

## 2020-06-12 ENCOUNTER — Other Ambulatory Visit: Payer: Self-pay

## 2020-06-12 ENCOUNTER — Encounter: Payer: Self-pay | Admitting: Emergency Medicine

## 2020-06-12 ENCOUNTER — Ambulatory Visit
Admission: EM | Admit: 2020-06-12 | Discharge: 2020-06-12 | Disposition: A | Discharge status: Home / Self Care | Payer: Medicaid Other | Attending: Family Medicine | Admitting: Family Medicine

## 2020-06-12 DIAGNOSIS — R519 Headache, unspecified: Secondary | ICD-10-CM

## 2020-06-12 MED ORDER — BUTALBITAL-APAP-CAFFEINE 50-325-40 MG PO TABS: 1.0000 | ORAL_TABLET | Freq: Four times a day (QID) | ORAL | 0 refills | Status: AC | PRN
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
## 2020-06-12 NOTE — Discharge Instructions (Signed)
Rest  Medication as prescribed.  Take care  Dr. Kaileena Obi  

## 2020-06-12 NOTE — ED Provider Notes (Signed)
[facility]    CSN: [id number] Arrival date & time: 06/12/20  1418  History   Chief Complaint Chief Complaint  Patient presents with  . Headache   HPI 44 year old female presents with headache.  2-day history of headache.  Located behind the eyes and in the frontal region.  She reports associated nausea and some mild photophobia.  Has a remote history of migraine.  Patient is concerned that this is secondary to tension.  No relieving factors.  Pain 9/10 in severity.  No other reported symptoms.  No other complaints.  Past Medical History:  Diagnosis Date  . Anxiety   . Asthma   . Depression   . Heart palpitations   . Hyperthyroidism   . Vitamin D deficiency    Past Surgical History:  Procedure Laterality Date  . ECTOPIC PREGNANCY SURGERY      OB History    Gravida  8   Para  2   Term  2   Preterm      AB  6   Living  2     SAB  3   TAB  2   Ectopic  1   Multiple      Live Births  2            Home Medications    Prior to Admission medications   Medication Sig Start Date End Date Taking? Authorizing Provider  cetirizine (ZYRTEC) 10 MG chewable tablet Chew 10 mg by mouth daily.   Yes [provider]  citalopram (CELEXA) 10 MG tablet Take 1 tablet by mouth in the morning and at bedtime.   Yes [provider]  lurasidone (LATUDA) 20 MG TABS tablet Take 1 tablet by mouth daily.   Yes [provider]  butalbital-acetaminophen-caffeine (FIORICET) 50-325-40 MG tablet Take 1 tablet by mouth every 6 (six) hours as needed for headache or migraine. 06/12/20 06/12/21  Tommie Sams, DO    Family History Family History  Problem Relation Age of Onset  . Fibroids Mother   . Breast cancer Neg Hx   . Ovarian cancer Neg Hx   . Stroke Neg Hx     Social History Social History   Tobacco Use  . Smoking status: Current Every Day Smoker    Packs/day: 1.00    Years: 25.00    Pack years: 25.00    Types: Cigarettes  .  Smokeless tobacco: Never Used  Vaping Use  . Vaping Use: Never used  Substance Use Topics  . Alcohol use: Yes    Comment: Socially   . Drug use: Not Currently    Types: Marijuana    Comment: last use 1-2 months ago (06/12/20)     Allergies   Bee venom, Folic acid, Latex, Sulfa antibiotics, and Tramadol   Review of Systems Review of Systems  Eyes: Positive for photophobia.  Gastrointestinal: Positive for nausea.  Neurological: Positive for headaches.   Physical Exam Triage Vital Signs ED Triage Vitals  Enc Vitals Group     BP 06/12/20 1527 111/80     Pulse Rate 06/12/20 1527 68     Resp 06/12/20 1527 18     Temp 06/12/20 1527 98.4 F (36.9 C)     Temp Source 06/12/20 1527 Oral     SpO2 06/12/20 1527 100 %     Weight 06/12/20 1529 135 lb (61.2 kg)     Height 06/12/20 1529 5\' 6"  (1.676 m)     Head Circumference --  Peak Flow --      Pain Score 06/12/20 1528 9     Pain Loc --      Pain Edu? --      Excl. in [place]? --    Updated Vital Signs BP 111/80 (BP Location: Right Arm)   Pulse 68   Temp 98.4 F (36.9 C) (Oral)   Resp 18   Ht 5\' 6"  (1.676 m)   Wt 61.2 kg   LMP 06/03/2020 (Approximate)   SpO2 100%   BMI 21.79 kg/m   Visual Acuity Right Eye Distance:   Left Eye Distance:   Bilateral Distance:    Right Eye Near:   Left Eye Near:    Bilateral Near:     Physical Exam Vitals and nursing note reviewed.  Constitutional:      General: She is not in acute distress.    Appearance: Normal appearance.  HENT:     Head: Normocephalic and atraumatic.  Eyes:     General:        Right eye: No discharge.        Left eye: No discharge.     Conjunctiva/sclera: Conjunctivae normal.     Pupils: Pupils are equal, round, and reactive to light.  Cardiovascular:     Rate and Rhythm: Normal rate and regular rhythm.     Heart sounds: No murmur heard.   Pulmonary:     Effort: Pulmonary effort is normal.     Breath sounds: Normal breath sounds.  Neurological:      Mental Status: She is alert.  Psychiatric:        Mood and Affect: Mood normal.        Behavior: Behavior normal.    UC Treatments / Results  Labs (all labs ordered are listed, but only abnormal results are displayed) Labs Reviewed - No data to display  EKG   Radiology No results found.  Procedures Procedures (including critical care time)  Medications Ordered in UC Medications - No data to display  Initial Impression / Assessment and Plan / UC Course  I have reviewed the triage vital signs and the nursing notes.  Pertinent labs & imaging results that were available during my care of the patient were reviewed by me and considered in my medical decision making (see chart for details).    44 year old female presents with acute headache.  Migraine versus tension.  Fioricet as directed.  Final Clinical Impressions(s) / UC Diagnoses   Final diagnoses:  Acute nonintractable headache, unspecified headache type     Discharge Instructions     Rest.  Medication as prescribed.  Take care  Dr. 55    ED Prescriptions    Medication Sig Dispense Auth. Provider   butalbital-acetaminophen-caffeine (FIORICET) 50-325-40 MG tablet Take 1 tablet by mouth every 6 (six) hours as needed for headache or migraine. 20 tablet Adriana Simas, DO     PDMP not reviewed this encounter.   Tommie Sams, Tommie Sams 06/12/20 1631

## 2021-10-25 ENCOUNTER — Other Ambulatory Visit: Payer: Self-pay | Admitting: Family Medicine

## 2021-10-25 DIAGNOSIS — Z1231 Encounter for screening mammogram for malignant neoplasm of breast: Secondary | ICD-10-CM

## 2023-04-27 ENCOUNTER — Ambulatory Visit (HOSPITAL_COMMUNITY)
Admission: EM | Admit: 2023-04-27 | Discharge: 2023-04-27 | Disposition: A | Discharge status: Home / Self Care | Payer: MEDICAID | Attending: Emergency Medicine | Admitting: Emergency Medicine

## 2023-04-27 ENCOUNTER — Encounter (HOSPITAL_COMMUNITY): Payer: Self-pay | Admitting: Emergency Medicine

## 2023-04-27 DIAGNOSIS — S60352A Superficial foreign body of left thumb, initial encounter: Secondary | ICD-10-CM | POA: Diagnosis not present

## 2023-04-27 DIAGNOSIS — N3090 Cystitis, unspecified without hematuria: Secondary | ICD-10-CM

## 2023-04-27 DIAGNOSIS — Z23 Encounter for immunization: Secondary | ICD-10-CM | POA: Diagnosis not present

## 2023-04-27 HISTORY — DX: Post-traumatic stress disorder, unspecified: F43.10

## 2023-04-27 LAB — POCT URINALYSIS DIP (MANUAL ENTRY)
Bilirubin, UA: NEGATIVE
Glucose, UA: NEGATIVE mg/dL
Ketones, POC UA: NEGATIVE mg/dL
Nitrite, UA: POSITIVE — AB
Spec Grav, UA: 1.02
Urobilinogen, UA: 0.2 U/dL
pH, UA: 6.5

## 2023-04-27 LAB — POCT URINE PREGNANCY: Preg Test, Ur: NEGATIVE

## 2023-04-27 MED ORDER — TETANUS-DIPHTH-ACELL PERTUSSIS 5-2.5-18.5 LF-MCG/0.5 IM SUSY
0.5000 mL | PREFILLED_SYRINGE | Freq: Once | INTRAMUSCULAR | Status: AC
  Administered 2023-04-27: 0.5 mL via INTRAMUSCULAR

## 2023-04-27 MED ORDER — TETANUS-DIPHTH-ACELL PERTUSSIS 5-2.5-18.5 LF-MCG/0.5 IM SUSY
PREFILLED_SYRINGE | INTRAMUSCULAR | Status: AC
  Filled 2023-04-27: qty 0.5

## 2023-04-27 MED ORDER — PHENAZOPYRIDINE HCL 95 MG PO TABS: 95.0000 mg | ORAL_TABLET | Freq: Three times a day (TID) | ORAL | 0 refills | Status: DC | PRN

## 2023-04-27 MED ORDER — NITROFURANTOIN MONOHYD MACRO 100 MG PO CAPS: 100.0000 mg | ORAL_CAPSULE | Freq: Two times a day (BID) | ORAL | 0 refills | Status: DC

## 2023-04-27 MED ORDER — PENTAFLUOROPROP-TETRAFLUOROETH EX AERO
INHALATION_SPRAY | CUTANEOUS | Status: AC
  Filled 2023-04-27: qty 30

## 2023-04-27 NOTE — ED Provider Notes (Signed)
[facility]    CSN: [id number] Arrival date & time: 04/27/23  1221      History   Chief Complaint Chief Complaint  Patient presents with   Foreign Body in Skin   Vaginal Bleeding    HPI Cheyenne Callahan is a 47 y.o. female.   Patient reports to clinic for complaints of a small shard of glass in her left thumb as well as passing clots today.  A few days ago she did not realize that a perfume bottle had broken in her purse when she stuck her hand in and she got a small shard of glass in her thumb.  She has not tried to remove it at home.  Her last tetanus was over 5 years ago.  She is also concerned because her menstrual cycle was 2 days late and she passed a large clot in the restroom today.  She is doing some over-the-counter fertility treatments and is actively trying to get pregnant.  She is sexually active.  Has not taken a pregnancy test at home.  Some dysuria.  Denies fevers, flank pain, nausea or vomiting.     The history is provided by the patient and medical records.  Vaginal Bleeding Associated symptoms: dysuria   Associated symptoms: no fever     Past Medical History:  Diagnosis Date   Anxiety    Asthma    Depression    Heart palpitations    Hyperthyroidism    PTSD (post-traumatic stress disorder)    Vitamin D deficiency     There are no problems to display for this patient.   Past Surgical History:  Procedure Laterality Date   ECTOPIC PREGNANCY SURGERY      OB History     Gravida  8   Para  2   Term  2   Preterm      AB  6   Living  2      SAB  3   IAB  2   Ectopic  1   Multiple      Live Births  2            Home Medications    Prior to Admission medications   Medication Sig Start Date End Date Taking? Authorizing Provider  nitrofurantoin, macrocrystal-monohydrate, (MACROBID) 100 MG capsule Take 1 capsule (100 mg total) by mouth 2 (two) times daily. 04/27/23  Yes Rinaldo Ratel, [place] N, FNP   phenazopyridine (PYRIDIUM) 95 MG tablet Take 1 tablet (95 mg total) by mouth 3 (three) times daily as needed for pain. 04/27/23  Yes Rinaldo Ratel, [place] N, FNP  cetirizine (ZYRTEC) 10 MG chewable tablet Chew 10 mg by mouth daily.    [provider]  citalopram (CELEXA) 10 MG tablet Take 1 tablet by mouth in the morning and at bedtime.    [provider]  hydrOXYzine (ATARAX) 25 MG tablet Take 25 mg by mouth every 6 (six) hours as needed for anxiety.    [provider]  lurasidone (LATUDA) 20 MG TABS tablet Take 1 tablet by mouth daily.    [provider]    Family History Family History  Problem Relation Age of Onset   Fibroids Mother    Breast cancer Neg Hx    Ovarian cancer Neg Hx    Stroke Neg Hx     Social History Social History   Tobacco Use   Smoking status: Every Day    Packs/day: 1.00    Years: 25.00  Additional pack years: 0.00    Total pack years: 25.00    Types: Cigarettes   Smokeless tobacco: Never  Vaping Use   Vaping Use: Never used  Substance Use Topics   Alcohol use: Yes    Comment: Socially    Drug use: Not Currently    Types: Marijuana    Comment: last use 1-2 months ago (06/12/20)     Allergies   Bee venom, Folic acid, Latex, Sulfa antibiotics, and Tramadol   Review of Systems Review of Systems  Constitutional:  Negative for fever.  Genitourinary:  Positive for dysuria, menstrual problem and vaginal bleeding.  Skin:  Positive for wound.     Physical Exam Triage Vital Signs ED Triage Vitals  Enc Vitals Group     BP 04/27/23 1324 107/71     Pulse Rate 04/27/23 1324 71     Resp 04/27/23 1324 16     Temp 04/27/23 1324 98.5 F (36.9 C)     Temp Source 04/27/23 1324 Oral     SpO2 04/27/23 1324 98 %     Weight --      Height --      Head Circumference --      Peak Flow --      Pain Score 04/27/23 1322 3     Pain Loc --      Pain Edu? --      Excl. in [place]? --    No data found.  Updated Vital Signs BP  107/71 (BP Location: Right Arm)   Pulse 71   Temp 98.5 F (36.9 C) (Oral)   Resp 16   LMP 04/26/2023   SpO2 98%   Visual Acuity Right Eye Distance:   Left Eye Distance:   Bilateral Distance:    Right Eye Near:   Left Eye Near:    Bilateral Near:     Physical Exam Vitals and nursing note reviewed.  Constitutional:      Appearance: Normal appearance.  HENT:     Head: Normocephalic and atraumatic.     Right Ear: External ear normal.     Left Ear: External ear normal.     Nose: Nose normal.     Mouth/Throat:     Mouth: Mucous membranes are moist.  Eyes:     Conjunctiva/sclera: Conjunctivae normal.  Cardiovascular:     Rate and Rhythm: Normal rate.  Pulmonary:     Effort: Pulmonary effort is normal. No respiratory distress.  Musculoskeletal:        General: Normal range of motion.  Skin:    General: Skin is warm and dry.     Capillary Refill: Capillary refill takes less than 2 seconds.     Comments: Minuscule piece of glass in left thumb.  Not obviously visible, but palpable.   Neurological:     General: No focal deficit present.     Mental Status: She is alert and oriented to person, place, and time.  Psychiatric:        Mood and Affect: Mood normal.        Behavior: Behavior normal. Behavior is cooperative.      UC Treatments / Results  Labs (all labs ordered are listed, but only abnormal results are displayed) Labs Reviewed  POCT URINALYSIS DIP (MANUAL ENTRY) - Abnormal; Notable for the following components:      Result Value   Blood, UA large (*)    Protein Ur, POC trace (*)    Nitrite, UA Positive (*)  Leukocytes, UA Small (1+) (*)    All other components within normal limits  URINE CULTURE  POCT URINE PREGNANCY    EKG   Radiology No results found.  Procedures Procedures (including critical care time)  Medications Ordered in UC Medications  Tdap (BOOSTRIX) injection 0.5 mL (0.5 mLs Intramuscular Given 04/27/23 1354)    Initial Impression  / Assessment and Plan / UC Course  I have reviewed the triage vital signs and the nursing notes.  Pertinent labs & imaging results that were available during my care of the patient were reviewed by me and considered in my medical decision making (see chart for details).  Vitals and triage reviewed, patient is hemodynamically stable.  Reports dysuria and passing a large clot.  Urinalysis significant for red blood cells, protein, nitrates and leukocytes.  Will send for culture, placed on Macrobid and Azo.  There is a minuscule piece of glass in her left thumb that be attempted to review negative after cleaning and using topical PainEase.  Unclear if foreign body was removed, patient reports symptomatic relief.  Encouraged warm compresses and that this small piece of glass should eventually work its way out.  Educated on signs and symptoms of infection.  Tdap updated.  Plan of care, follow-up care and return precautions given, no questions at this time.  Final diagnoses:  Cystitis  Foreign body of left thumb, initial encounter     Discharge Instructions      Your urine came back positive for urinary tract infection.  Please take all antibiotics as prescribed and until finished.  Ensure you drink at least 64 ounces of water daily to help assist flush your kidneys.  He can also take the Pyridium up to 3 times daily for any dysuria or pain with urination.  We attempted to get the glass out of your left thumb.  We also updated your tetanus today.  If you have any more discomfort, please do warm soaks to attempt to draw the foreign body to the surface.  Please clean the area before you attempt to remove any more glass, to help reduce the chance of infection.  Return to clinic for any new or urgent symptoms.  Please return if you develop fever, warmth, streaking or any signs of infection of your thumb.  Return to clinic if you have any nausea, vomiting or flank pain despite antibiotics.  We are sending  your urine off for culture and we will contact you if we need to modify your antibiotic therapy.      ED Prescriptions     Medication Sig Dispense Auth. Provider   nitrofurantoin, macrocrystal-monohydrate, (MACROBID) 100 MG capsule Take 1 capsule (100 mg total) by mouth 2 (two) times daily. 10 capsule Ithzel Fedorchak, [place] N, FNP   phenazopyridine (PYRIDIUM) 95 MG tablet Take 1 tablet (95 mg total) by mouth 3 (three) times daily as needed for pain. 10 tablet Mckynna Vanloan, [place] N, FNP      PDMP not reviewed this encounter.   Perle Brickhouse, [place] N, [place] 04/27/23 1414

## 2023-04-27 NOTE — ED Triage Notes (Signed)
Pt reports period was 2 days late. Started spotting yesterday that as real dark and then turned light pink. Today when used restroom had a blood clot, which isn't normal.   Adds that has piece of glass stuck in left thumb for 2-3 days when a perfume bottle broke in her purse.

## 2023-04-27 NOTE — Discharge Instructions (Addendum)
Your urine came back positive for urinary tract infection.  Please take all antibiotics as prescribed and until finished.  Ensure you drink at least 64 ounces of water daily to help assist flush your kidneys.  He can also take the Pyridium up to 3 times daily for any dysuria or pain with urination.  We attempted to get the glass out of your left thumb.  We also updated your tetanus today.  If you have any more discomfort, please do warm soaks to attempt to draw the foreign body to the surface.  Please clean the area before you attempt to remove any more glass, to help reduce the chance of infection.  Return to clinic for any new or urgent symptoms.  Please return if you develop fever, warmth, streaking or any signs of infection of your thumb.  Return to clinic if you have any nausea, vomiting or flank pain despite antibiotics.  We are sending your urine off for culture and we will contact you if we need to modify your antibiotic therapy.

## 2023-04-28 LAB — URINE CULTURE

## 2023-04-29 LAB — URINE CULTURE: Culture: 80000 — AB

## 2023-07-09 ENCOUNTER — Ambulatory Visit (INDEPENDENT_AMBULATORY_CARE_PROVIDER_SITE_OTHER): Payer: No Typology Code available for payment source

## 2023-07-09 ENCOUNTER — Ambulatory Visit (HOSPITAL_COMMUNITY)
Admission: EM | Admit: 2023-07-09 | Discharge: 2023-07-09 | Disposition: A | Discharge status: Home / Self Care | Payer: No Typology Code available for payment source | Attending: Physician Assistant | Admitting: Physician Assistant

## 2023-07-09 ENCOUNTER — Encounter (HOSPITAL_COMMUNITY): Payer: Self-pay

## 2023-07-09 DIAGNOSIS — S39012A Strain of muscle, fascia and tendon of lower back, initial encounter: Secondary | ICD-10-CM

## 2023-07-09 DIAGNOSIS — S161XXA Strain of muscle, fascia and tendon at neck level, initial encounter: Secondary | ICD-10-CM | POA: Diagnosis present

## 2023-07-09 DIAGNOSIS — R8290 Unspecified abnormal findings in urine: Secondary | ICD-10-CM

## 2023-07-09 LAB — POCT URINALYSIS DIP (MANUAL ENTRY)
Bilirubin, UA: NEGATIVE
Blood, UA: NEGATIVE
Glucose, UA: NEGATIVE mg/dL
Ketones, POC UA: NEGATIVE mg/dL
Nitrite, UA: NEGATIVE
Protein Ur, POC: NEGATIVE mg/dL
Spec Grav, UA: 1.025 (ref 1.010–1.025)
Urobilinogen, UA: 0.2 U/dL
pH, UA: 5.5 (ref 5.0–8.0)

## 2023-07-09 LAB — POCT URINE PREGNANCY: Preg Test, Ur: NEGATIVE

## 2023-07-09 MED ORDER — METHOCARBAMOL 500 MG PO TABS: 500.0000 mg | ORAL_TABLET | Freq: Two times a day (BID) | ORAL | 0 refills | Status: DC

## 2023-07-09 MED ORDER — KETOROLAC TROMETHAMINE 30 MG/ML IJ SOLN
30.0000 mg | Freq: Once | INTRAMUSCULAR | Status: AC
  Administered 2023-07-09: 30 mg via INTRAMUSCULAR

## 2023-07-09 MED ORDER — LIDOCAINE 5 % EX PTCH: 1.0000 | MEDICATED_PATCH | CUTANEOUS | 0 refills | Status: DC

## 2023-07-09 MED ORDER — KETOROLAC TROMETHAMINE 30 MG/ML IJ SOLN
INTRAMUSCULAR | Status: AC
  Filled 2023-07-09: qty 1

## 2023-07-09 NOTE — Discharge Instructions (Addendum)
Your x-rays did not show any evidence of broken bones or dislocated bones.  I believe that you have injured the muscles.  Start Robaxin up to twice a day.  This make you sleepy so do not drive drink alcohol with taking this medication.  Use heat and gentle stretch for additional symptom relief.  You can use lidocaine patch on your lower back.  Use only 1 patch per 24 hours and apply it for only 12 hours at a time.  I do think you would benefit from seeing a sports medicine provider.  Please call them to schedule an appointment.  As we discussed, if your symptoms are not improving or if anything worsens you need to go to the emergency room immediately.  Your urine did have some white blood cells.  We will send this for culture and contact you if need to start an antibiotic.  Make sure you are drinking plenty of fluid.
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
## 2023-07-11 LAB — URINE CULTURE: Culture: >=100000 — AB

## 2023-07-12 ENCOUNTER — Telehealth: Payer: Self-pay

## 2023-07-12 MED ORDER — CIPROFLOXACIN HCL 500 MG PO TABS: 500.0000 mg | ORAL_TABLET | Freq: Two times a day (BID) | ORAL | 0 refills | Status: AC

## 2023-08-01 ENCOUNTER — Encounter (HOSPITAL_COMMUNITY): Payer: Self-pay

## 2023-08-01 ENCOUNTER — Ambulatory Visit (HOSPITAL_COMMUNITY)
Admission: EM | Admit: 2023-08-01 | Discharge: 2023-08-01 | Disposition: A | Discharge status: Home / Self Care | Payer: MEDICAID | Attending: Emergency Medicine | Admitting: Emergency Medicine

## 2023-08-01 DIAGNOSIS — Z87891 Personal history of nicotine dependence: Secondary | ICD-10-CM | POA: Diagnosis not present

## 2023-08-01 DIAGNOSIS — R300 Dysuria: Secondary | ICD-10-CM

## 2023-08-01 DIAGNOSIS — N760 Acute vaginitis: Secondary | ICD-10-CM | POA: Insufficient documentation

## 2023-08-01 LAB — POCT URINALYSIS DIP (MANUAL ENTRY)
Bilirubin, UA: NEGATIVE
Blood, UA: NEGATIVE
Glucose, UA: NEGATIVE mg/dL
Ketones, POC UA: NEGATIVE mg/dL
Nitrite, UA: NEGATIVE
Protein Ur, POC: NEGATIVE mg/dL
Spec Grav, UA: 1.02 (ref 1.010–1.025)
Urobilinogen, UA: 0.2 U/dL
pH, UA: 7 (ref 5.0–8.0)

## 2023-08-01 MED ORDER — FLUCONAZOLE 150 MG PO TABS
ORAL_TABLET | ORAL | 0 refills | Status: DC
Start: 2023-08-01 — End: 2023-08-02

## 2023-08-01 NOTE — Discharge Instructions (Addendum)
Take medication as prescribed for likely yeast infection. We are sending out labs and will contact you with results.  Follow-up with PCP or gyn if no improvement in a week. Go to the ER if severe abdominal or back pain, vomiting, or fever.

## 2023-08-01 NOTE — ED Triage Notes (Signed)
Patient here today with c/o vaginal itching, discharge, and burning in urination X 3 days. She tried using Vagisil with no relief.

## 2023-08-01 NOTE — ED Provider Notes (Signed)
[facility]    CSN: [id number] Arrival date & time: 08/01/23  1143      History   Chief Complaint Chief Complaint  Patient presents with   Vaginal Discharge    HPI Cheyenne Callahan is a 47 y.o. female.   Patient presents with concerns of possible yeast infection. She reports vaginal discharge and itching for the past few days. She reports occasional burning with urination. She denies urinary frequency or urgency, abdominal or back pain, n/v, or fever. She tried using Vagisil with some mild temporary relief. She denies prior similar. The patient states she was on antibiotics a couple weeks ago for a UTI.  The history is provided by the patient.  Vaginal Discharge Associated symptoms: dysuria   Associated symptoms: no abdominal pain, no fever, no nausea and no vomiting     Past Medical History:  Diagnosis Date   Anxiety    Asthma    Depression    Heart palpitations    Hyperthyroidism    PTSD (post-traumatic stress disorder)    Vitamin D deficiency     There are no problems to display for this patient.   Past Surgical History:  Procedure Laterality Date   ECTOPIC PREGNANCY SURGERY      OB History     Gravida  8   Para  2   Term  2   Preterm      AB  6   Living  2      SAB  3   IAB  2   Ectopic  1   Multiple      Live Births  2            Home Medications    Prior to Admission medications   Medication Sig Start Date End Date Taking? Authorizing Provider  fluconazole (DIFLUCAN) 150 MG tablet Take one tablet once. Repeat in 3 days if symptoms persist. 08/01/23  Yes Markitta Ausburn L, PA  albuterol (VENTOLIN HFA) 108 (90 Base) MCG/ACT inhaler Inhale 1-2 puffs into the lungs every 6 (six) hours as needed.    [provider]  cetirizine (ZYRTEC) 10 MG chewable tablet Chew 10 mg by mouth daily.    [provider]  citalopram (CELEXA) 10 MG tablet Take 1 tablet by mouth in the morning and at bedtime.     [provider]  fluticasone (FLONASE) 50 MCG/ACT nasal spray Place 1 spray into both nostrils daily.    [provider]  hydrOXYzine (ATARAX) 25 MG tablet Take 25 mg by mouth every 6 (six) hours as needed for anxiety.    [provider]  lurasidone (LATUDA) 20 MG TABS tablet Take 1 tablet by mouth daily.    [provider]    Family History Family History  Problem Relation Age of Onset   Fibroids Mother    Breast cancer Neg Hx    Ovarian cancer Neg Hx    Stroke Neg Hx     Social History Social History   Tobacco Use   Smoking status: Former    Current packs/day: 1.00    Average packs/day: 1 pack/day for 25.0 years (25.0 ttl pk-yrs)    Types: Cigarettes   Smokeless tobacco: Never  Vaping Use   Vaping status: Never Used  Substance Use Topics   Alcohol use: Not Currently    Comment: Socially    Drug use: Not Currently    Types: Marijuana    Comment: last use 1-2 months ago (06/12/20)  Allergies   Bee venom, Folic acid, Latex, Sulfa antibiotics, and Tramadol   Review of Systems Review of Systems  Constitutional:  Negative for fatigue and fever.  Gastrointestinal:  Negative for abdominal pain, nausea and vomiting.  Genitourinary:  Positive for dysuria and vaginal discharge. Negative for frequency, genital sores, hematuria and urgency.  Musculoskeletal:  Negative for back pain.  Skin:  Negative for rash.     Physical Exam Triage Vital Signs ED Triage Vitals [08/01/23 1215]  Encounter Vitals Group     BP 121/85     Systolic BP Percentile      Diastolic BP Percentile      Pulse Rate 81     Resp 16     Temp 99.1 F (37.3 C)     Temp Source Oral     SpO2 98 %     Weight 143 lb (64.9 kg)     Height 5\' 6"  (1.676 m)     Head Circumference      Peak Flow      Pain Score 3     Pain Loc      Pain Education      Exclude from Growth Chart    No data found.  Updated Vital Signs BP 121/85 (BP Location: Left Arm)   Pulse 81    Temp 99.1 F (37.3 C) (Oral)   Resp 16   Ht 5\' 6"  (1.676 m)   Wt 143 lb (64.9 kg)   LMP 07/18/2023 (Exact Date)   SpO2 98%   BMI 23.08 kg/m   Visual Acuity Right Eye Distance:   Left Eye Distance:   Bilateral Distance:    Right Eye Near:   Left Eye Near:    Bilateral Near:     Physical Exam Vitals and nursing note reviewed.  Constitutional:      General: She is not in acute distress. Cardiovascular:     Rate and Rhythm: Normal rate and regular rhythm.     Heart sounds: Normal heart sounds.  Pulmonary:     Effort: Pulmonary effort is normal.     Breath sounds: Normal breath sounds.  Abdominal:     Palpations: Abdomen is soft.     Tenderness: There is no abdominal tenderness. There is no right CVA tenderness or left CVA tenderness.  Genitourinary:    Comments: Pt deferred Neurological:     Mental Status: She is alert.      UC Treatments / Results  Labs (all labs ordered are listed, but only abnormal results are displayed) Labs Reviewed  POCT URINALYSIS DIP (MANUAL ENTRY) - Abnormal; Notable for the following components:      Result Value   Leukocytes, UA Trace (*)    All other components within normal limits  URINE CULTURE  CERVICOVAGINAL ANCILLARY ONLY    EKG   Radiology No results found.  Procedures Procedures (including critical care time)  Medications Ordered in UC Medications - No data to display  Initial Impression / Assessment and Plan / UC Course  I have reviewed the triage vital signs and the nursing notes.  Pertinent labs & imaging results that were available during my care of the patient were reviewed by me and considered in my medical decision making (see chart for details).     Empiric tx for vaginal candida given s/s, swab sent out. Trace leuks on urine, will send out for culture given some dysuria (may be due to candida) and relatively recent UTI. Discussed return/ER precautions with pt.  E/M: 1 acute uncomplicated illness, 3  data (UA, Ucx, vaginitis), moderate risk due to prescription management   Final Clinical Impressions(s) / UC Diagnoses   Final diagnoses:  Acute vaginitis  Dysuria     Discharge Instructions      Take medication as prescribed for likely yeast infection. We are sending out labs and will contact you with results.  Follow-up with PCP or gyn if no improvement in a week. Go to the ER if severe abdominal or back pain, vomiting, or fever.    ED Prescriptions     Medication Sig Dispense Auth. Provider   fluconazole (DIFLUCAN) 150 MG tablet Take one tablet once. Repeat in 3 days if symptoms persist. 2 tablet Vallery Sa, Blinda Turek L, PA      PDMP not reviewed this encounter.   Estanislado Pandy, [place] 08/01/23 1302

## 2023-08-02 ENCOUNTER — Telehealth: Payer: Self-pay

## 2023-08-02 ENCOUNTER — Telehealth: Payer: Self-pay | Admitting: Emergency Medicine

## 2023-08-02 LAB — CERVICOVAGINAL ANCILLARY ONLY
Bacterial Vaginitis (gardnerella): POSITIVE — AB
Candida Glabrata: NEGATIVE
Candida Vaginitis: POSITIVE — AB
Chlamydia: NEGATIVE
Comment: NEGATIVE
Comment: NEGATIVE
Comment: NEGATIVE
Comment: NEGATIVE
Comment: NEGATIVE
Comment: NORMAL
Neisseria Gonorrhea: NEGATIVE
Trichomonas: NEGATIVE

## 2023-08-02 LAB — URINE CULTURE: Culture: >=100000 — AB

## 2023-08-02 MED ORDER — METRONIDAZOLE 500 MG PO TABS: 500.0000 mg | ORAL_TABLET | Freq: Two times a day (BID) | ORAL | 0 refills | Status: AC

## 2023-08-02 MED ORDER — FLUCONAZOLE 150 MG PO TABS: 150.0000 mg | ORAL_TABLET | Freq: Once | ORAL | 0 refills | Status: AC

## 2023-08-02 MED ORDER — CEPHALEXIN 500 MG PO CAPS: 500.0000 mg | ORAL_CAPSULE | Freq: Two times a day (BID) | ORAL | 0 refills | Status: AC

## 2023-08-02 NOTE — Telephone Encounter (Signed)
Patient requested antibiotic associated yeast medicine for the END of her antibiotics Sent, per protocol

## 2023-08-02 NOTE — Telephone Encounter (Signed)
Per protocol, pt requires tx with metronidazole. Rx sent to pharmacy on file.

## 2023-08-02 NOTE — Telephone Encounter (Signed)
Per  Helaine Chess,  PA-C, "Keflex will be fine. 500 mg BID x 5 days." Reviewed with patient, verified pharmacy, prescription sent.

## 2023-12-11 ENCOUNTER — Other Ambulatory Visit: Payer: Self-pay

## 2023-12-11 ENCOUNTER — Ambulatory Visit (HOSPITAL_COMMUNITY)
Admission: RE | Admit: 2023-12-11 | Discharge: 2023-12-11 | Disposition: A | Discharge status: Home / Self Care | Payer: No Typology Code available for payment source | Source: Ambulatory Visit | Attending: Emergency Medicine | Admitting: Emergency Medicine

## 2023-12-11 ENCOUNTER — Encounter (HOSPITAL_COMMUNITY): Payer: Self-pay

## 2023-12-11 VITALS — BP 108/72 | HR 60 | Temp 98.1°F | Resp 18

## 2023-12-11 DIAGNOSIS — N3000 Acute cystitis without hematuria: Secondary | ICD-10-CM | POA: Diagnosis not present

## 2023-12-11 DIAGNOSIS — N898 Other specified noninflammatory disorders of vagina: Secondary | ICD-10-CM | POA: Insufficient documentation

## 2023-12-11 DIAGNOSIS — M545 Low back pain, unspecified: Secondary | ICD-10-CM | POA: Diagnosis present

## 2023-12-11 LAB — POCT URINALYSIS DIP (MANUAL ENTRY)
Bilirubin, UA: NEGATIVE
Blood, UA: NEGATIVE
Glucose, UA: NEGATIVE mg/dL
Ketones, POC UA: NEGATIVE mg/dL
Nitrite, UA: POSITIVE — AB
Protein Ur, POC: NEGATIVE mg/dL
Spec Grav, UA: 1.02 (ref 1.010–1.025)
Urobilinogen, UA: 0.2 U/dL
pH, UA: 5.5 (ref 5.0–8.0)

## 2023-12-11 MED ORDER — LIDOCAINE 5 % EX PTCH: 1.0000 | MEDICATED_PATCH | CUTANEOUS | 0 refills | Status: AC

## 2023-12-11 MED ORDER — NITROFURANTOIN MONOHYD MACRO 100 MG PO CAPS: 100.0000 mg | ORAL_CAPSULE | Freq: Two times a day (BID) | ORAL | 0 refills | Status: AC

## 2023-12-11 MED ORDER — FLUCONAZOLE 150 MG PO TABS: ORAL_TABLET | ORAL | 0 refills | Status: AC

## 2023-12-11 NOTE — ED Triage Notes (Signed)
 Pt reports dysuria and fishy odor.

## 2023-12-11 NOTE — ED Provider Notes (Signed)
 [facility]    CSN: [id number] Arrival date & time: 12/11/23  1914      History   Chief Complaint Chief Complaint  Patient presents with   Abdominal Pain    Entered by patient   Dysuria    HPI Cheyenne Callahan is a 48 y.o. female.   Patient presents with left low back pain x 2 days. Denies any known injury. Denies numbness, tingling, or radiation of pain.   Patient also presents with dysuria, foul smelling urine, and vaginal discharge x 2 days. Denies abdominal pain, fever, and vaginal bleeding.    Abdominal Pain Associated symptoms: dysuria   Dysuria Associated symptoms: abdominal pain     Past Medical History:  Diagnosis Date   Anxiety    Asthma    Depression    Heart palpitations    Hyperthyroidism    PTSD (post-traumatic stress disorder)    Vitamin D deficiency     There are no active problems to display for this patient.   Past Surgical History:  Procedure Laterality Date   ECTOPIC PREGNANCY SURGERY      OB History     Gravida  8   Para  2   Term  2   Preterm      AB  6   Living  2      SAB  3   IAB  2   Ectopic  1   Multiple      Live Births  2            Home Medications    Prior to Admission medications   Medication Sig Start Date End Date Taking? Authorizing Provider  albuterol (VENTOLIN HFA) 108 (90 Base) MCG/ACT inhaler Inhale 1-2 puffs into the lungs every 6 (six) hours as needed.   Yes [provider]  cetirizine (ZYRTEC) 10 MG chewable tablet Chew 10 mg by mouth daily.   Yes [provider]  citalopram (CELEXA) 10 MG tablet Take 1 tablet by mouth in the morning and at bedtime.   Yes [provider]  fluconazole (DIFLUCAN) 150 MG tablet Take one tablet today and one tablet in 3 days if symptoms persist. 12/11/23  Yes Wynonia Lawman A, NP  hydrOXYzine (ATARAX) 25 MG tablet Take 25 mg by mouth every 6 (six) hours as needed for anxiety.   Yes [provider]   lidocaine (LIDODERM) 5 % Place 1 patch onto the skin daily. Remove & Discard patch within 12 hours or as directed by MD 12/11/23  Yes Wynonia Lawman A, NP  lurasidone (LATUDA) 20 MG TABS tablet Take 1 tablet by mouth daily.   Yes [provider]  nitrofurantoin, macrocrystal-monohydrate, (MACROBID) 100 MG capsule Take 1 capsule (100 mg total) by mouth 2 (two) times daily. 12/11/23  Yes Susann Givens, Jayke Caul A, NP  fluticasone (FLONASE) 50 MCG/ACT nasal spray Place 1 spray into both nostrils daily.    [provider]    Family History Family History  Problem Relation Age of Onset   Fibroids Mother    Breast cancer Neg Hx    Ovarian cancer Neg Hx    Stroke Neg Hx     Social History Social History   Tobacco Use   Smoking status: Former    Current packs/day: 1.00    Average packs/day: 1 pack/day for 25.0 years (25.0 ttl pk-yrs)    Types: Cigarettes   Smokeless tobacco: Never  Vaping Use   Vaping status: Never Used  Substance  Use Topics   Alcohol use: Not Currently    Comment: Socially    Drug use: Not Currently    Types: Marijuana    Comment: last use 1-2 months ago (06/12/20)     Allergies   Bee venom, Folic acid, Latex, Sulfa antibiotics, and Tramadol   Review of Systems Review of Systems  Gastrointestinal:  Positive for abdominal pain.  Genitourinary:  Positive for dysuria.   Per HPI  Physical Exam Triage Vital Signs ED Triage Vitals  Encounter Vitals Group     BP 12/11/23 0946 108/72     Systolic BP Percentile --      Diastolic BP Percentile --      Pulse Rate 12/11/23 0946 60     Resp 12/11/23 0946 18     Temp 12/11/23 0946 98.1 F (36.7 C)     Temp src --      SpO2 12/11/23 0946 99 %     Weight --      Height --      Head Circumference --      Peak Flow --      Pain Score 12/11/23 0943 8     Pain Loc --      Pain Education --      Exclude from Growth Chart --    No data found.  Updated Vital Signs BP 108/72   Pulse 60   Temp  98.1 F (36.7 C)   Resp 18   LMP 11/18/2023   SpO2 99%   Visual Acuity Right Eye Distance:   Left Eye Distance:   Bilateral Distance:    Right Eye Near:   Left Eye Near:    Bilateral Near:     Physical Exam Vitals and nursing note reviewed.  Constitutional:      General: She is awake. She is not in acute distress.    Appearance: Normal appearance. She is well-developed and well-groomed. She is not ill-appearing.  Cardiovascular:     Rate and Rhythm: Normal rate and regular rhythm.  Pulmonary:     Effort: Pulmonary effort is normal.     Breath sounds: Normal breath sounds.  Abdominal:     General: Abdomen is flat. Bowel sounds are normal.     Palpations: Abdomen is soft.     Tenderness: There is no abdominal tenderness. There is no right CVA tenderness or left CVA tenderness.  Musculoskeletal:        General: Normal range of motion.     Cervical back: Normal.     Thoracic back: Normal.     Lumbar back: Tenderness present. No swelling, edema, deformity or bony tenderness. Normal range of motion.  Skin:    General: Skin is warm and dry.  Neurological:     Mental Status: She is alert.  Psychiatric:        Behavior: Behavior is cooperative.      UC Treatments / Results  Labs (all labs ordered are listed, but only abnormal results are displayed) Labs Reviewed  POCT URINALYSIS DIP (MANUAL ENTRY) - Abnormal; Notable for the following components:      Result Value   Clarity, UA hazy (*)    Nitrite, UA Positive (*)    Leukocytes, UA Trace (*)    All other components within normal limits  URINE CULTURE  CERVICOVAGINAL ANCILLARY ONLY    EKG   Radiology No results found.  Procedures Procedures (including critical care time)  Medications Ordered in UC Medications - No data to  display  Initial Impression / Assessment and Plan / UC Course  I have reviewed the triage vital signs and the nursing notes.  Pertinent labs & imaging results that were available during  my care of the patient were reviewed by me and considered in my medical decision making (see chart for details).     Patient presented with 2-day history of left low back pain, dysuria, foul smelling urine, and vaginal discharge.   Patient reports thick, white vaginal discharge that normally clears with taking Diflucan.  Upon assessment patient is nontender to abdomen upon palpation, no CVA tenderness noted. Mild tenderness upon palpation to left lower back. No decreased ROM noted. Likely musculoskeletal vs kidney infection related due to lack of CVA tenderness.  UA revealed nitrites and trace leukocytes, will send urine culture. GU exam deferred. Patient performed self swab for BV and yeast infection.   Prescribed Macrobid for cystitis coverage. Prescribed Diflucan for yeast infection coverage. Prescribed lidocaine patches as needed for low back pain. Discussed follow-up and return precautions.  Final Clinical Impressions(s) / UC Diagnoses   Final diagnoses:  Acute cystitis without hematuria  Vaginal discharge  Acute right-sided low back pain without sciatica     Discharge Instructions      Start taking Macrobid twice daily for 5 days your urinary tract infection. I have prescribed 2 doses of Diflucan for yeast infection coverage. Your results will come back over the next few days and someone will call if results are positive and require additional treatment.   I have also prescribed lidocaine patches that you can apply to your back as needed for pain. Otherwise alternate between Tylenol and Ibuprofen as needed for pain. You can also apply heat as needed for pain. Return here as needed.     ED Prescriptions     Medication Sig Dispense Auth. Provider   nitrofurantoin, macrocrystal-monohydrate, (MACROBID) 100 MG capsule Take 1 capsule (100 mg total) by mouth 2 (two) times daily. 10 capsule Wynonia Lawman A, NP   fluconazole (DIFLUCAN) 150 MG tablet Take one tablet today and one  tablet in 3 days if symptoms persist. 2 tablet Susann Givens, Gaylia Kassel A, NP   lidocaine (LIDODERM) 5 % Place 1 patch onto the skin daily. Remove & Discard patch within 12 hours or as directed by MD 30 patch Letta Kocher, NP      PDMP not reviewed this encounter.   Wynonia Lawman A, NP 12/11/23 1034

## 2023-12-11 NOTE — Discharge Instructions (Addendum)
 Start taking Macrobid twice daily for 5 days your urinary tract infection. I have prescribed 2 doses of Diflucan for yeast infection coverage. Your results will come back over the next few days and someone will call if results are positive and require additional treatment.   I have also prescribed lidocaine patches that you can apply to your back as needed for pain. Otherwise alternate between Tylenol and Ibuprofen as needed for pain. You can also apply heat as needed for pain. Return here as needed.

## 2023-12-12 ENCOUNTER — Telehealth (HOSPITAL_COMMUNITY): Payer: Self-pay

## 2023-12-12 LAB — CERVICOVAGINAL ANCILLARY ONLY
Bacterial Vaginitis (gardnerella): POSITIVE — AB
Candida Glabrata: NEGATIVE
Candida Vaginitis: NEGATIVE
Comment: NEGATIVE
Comment: NEGATIVE
Comment: NEGATIVE

## 2023-12-12 MED ORDER — FLUCONAZOLE 150 MG PO TABS: 150.0000 mg | ORAL_TABLET | Freq: Once | ORAL | 0 refills | Status: AC

## 2023-12-12 MED ORDER — METRONIDAZOLE 0.75 % VA GEL: 1.0000 | Freq: Every day | VAGINAL | 0 refills | Status: AC

## 2023-12-12 NOTE — Telephone Encounter (Signed)
 Per protocol, pt requires tx with metronidazole. Rx sent to pharmacy on file.

## 2023-12-13 LAB — URINE CULTURE: Culture: >=100000 — AB

## 2024-01-13 ENCOUNTER — Other Ambulatory Visit: Payer: Self-pay | Admitting: Family Medicine

## 2024-01-13 DIAGNOSIS — Z1231 Encounter for screening mammogram for malignant neoplasm of breast: Secondary | ICD-10-CM
